# Patient Record
Sex: Female | Born: 1985 | Race: Black or African American | Hispanic: No | Marital: Single | Smoking: Current every day smoker
Health system: Southern US, Community
[De-identification: ages and names within clinical notes are randomized; demographics above are authoritative.]

## PROBLEM LIST (undated history)

## (undated) DIAGNOSIS — G43909 Migraine, unspecified, not intractable, without status migrainosus: Secondary | ICD-10-CM

## (undated) DIAGNOSIS — F419 Anxiety disorder, unspecified: Secondary | ICD-10-CM

## (undated) HISTORY — DX: Migraine, unspecified, not intractable, without status migrainosus: G43.909

## (undated) HISTORY — PX: HERNIA REPAIR: SHX51

---

## 2005-04-14 ENCOUNTER — Emergency Department (HOSPITAL_COMMUNITY): Admission: EM | Admit: 2005-04-14 | Discharge: 2005-04-15 | Payer: Self-pay | Admitting: Emergency Medicine

## 2005-06-25 ENCOUNTER — Emergency Department (HOSPITAL_COMMUNITY): Admission: EM | Admit: 2005-06-25 | Discharge: 2005-06-25 | Payer: Self-pay | Admitting: Emergency Medicine

## 2007-07-22 ENCOUNTER — Emergency Department (HOSPITAL_COMMUNITY): Admission: EM | Admit: 2007-07-22 | Discharge: 2007-07-22 | Payer: Self-pay | Admitting: Emergency Medicine

## 2008-03-04 ENCOUNTER — Emergency Department (HOSPITAL_COMMUNITY): Admission: EM | Admit: 2008-03-04 | Discharge: 2008-03-04 | Payer: Self-pay | Admitting: Emergency Medicine

## 2011-03-20 LAB — COMPREHENSIVE METABOLIC PANEL
AST: 25
Albumin: 3.8
Alkaline Phosphatase: 51
BUN: 7
CO2: 27
Chloride: 102
GFR calc non Af Amer: >60
Potassium: 3.2 — ABNORMAL LOW
Total Bilirubin: 0.7

## 2011-03-20 LAB — URINALYSIS, ROUTINE W REFLEX MICROSCOPIC
Bilirubin Urine: NEGATIVE
Glucose, UA: NEGATIVE
Hgb urine dipstick: NEGATIVE
Ketones, ur: >80 — AB
Protein, ur: NEGATIVE
pH: 6

## 2011-03-20 LAB — CBC
HCT: 39.7
Platelets: 245
RBC: 4.29
WBC: 2.7 — ABNORMAL LOW

## 2011-03-20 LAB — DIFFERENTIAL
Basophils Absolute: 0
Basophils Relative: 0
Eosinophils Relative: 1
Monocytes Absolute: 0.5

## 2011-03-20 LAB — RAPID STREP SCREEN (MED CTR MEBANE ONLY): Streptococcus, Group A Screen (Direct): NEGATIVE

## 2011-04-02 LAB — POCT I-STAT, CHEM 8
Calcium, Ion: 1.24
Creatinine, Ser: 1
Glucose, Bld: 107 — ABNORMAL HIGH
Hemoglobin: 15.6 — ABNORMAL HIGH
TCO2: 29

## 2011-06-22 ENCOUNTER — Emergency Department (HOSPITAL_COMMUNITY): Payer: Managed Care, Other (non HMO)

## 2011-06-22 ENCOUNTER — Emergency Department (HOSPITAL_COMMUNITY)
Admission: EM | Admit: 2011-06-22 | Discharge: 2011-06-22 | Disposition: A | Discharge status: Home / Self Care | Payer: Managed Care, Other (non HMO) | Attending: Emergency Medicine | Admitting: Emergency Medicine

## 2011-06-22 DIAGNOSIS — R079 Chest pain, unspecified: Secondary | ICD-10-CM | POA: Insufficient documentation

## 2011-06-22 DIAGNOSIS — S20219A Contusion of unspecified front wall of thorax, initial encounter: Secondary | ICD-10-CM
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
## 2011-06-22 MED ORDER — NAPROXEN 375 MG PO TABS: 375.0000 mg | ORAL_TABLET | Freq: Two times a day (BID) | ORAL | Status: AC

## 2011-06-22 MED ORDER — HYDROCODONE-ACETAMINOPHEN 5-500 MG PO TABS: 1.0000 | ORAL_TABLET | Freq: Four times a day (QID) | ORAL | Status: AC | PRN
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
## 2011-06-23 NOTE — ED Provider Notes (Signed)
Medical screening examination/treatment/procedure(s) were performed by non-physician practitioner and as supervising physician I was immediately available for consultation/collaboration.   Castle Lamons, MD 06/23/11 0039 

## 2016-08-12 ENCOUNTER — Encounter (HOSPITAL_COMMUNITY): Payer: Self-pay | Admitting: Emergency Medicine

## 2016-08-12 DIAGNOSIS — F17210 Nicotine dependence, cigarettes, uncomplicated: Secondary | ICD-10-CM | POA: Insufficient documentation

## 2016-08-12 DIAGNOSIS — R51 Headache: Secondary | ICD-10-CM | POA: Diagnosis not present

## 2016-08-12 DIAGNOSIS — M791 Myalgia: Secondary | ICD-10-CM | POA: Insufficient documentation

## 2016-08-12 DIAGNOSIS — R509 Fever, unspecified: Secondary | ICD-10-CM | POA: Diagnosis not present

## 2016-08-12 DIAGNOSIS — R05 Cough: Secondary | ICD-10-CM | POA: Diagnosis not present

## 2016-08-12 DIAGNOSIS — R0981 Nasal congestion: Secondary | ICD-10-CM | POA: Diagnosis not present

## 2016-08-12 DIAGNOSIS — R067 Sneezing: Secondary | ICD-10-CM | POA: Diagnosis not present

## 2016-08-12 DIAGNOSIS — J029 Acute pharyngitis, unspecified: Secondary | ICD-10-CM | POA: Insufficient documentation

## 2016-08-12 DIAGNOSIS — J3489 Other specified disorders of nose and nasal sinuses: Secondary | ICD-10-CM | POA: Insufficient documentation

## 2016-08-12 DIAGNOSIS — R5383 Other fatigue: Secondary | ICD-10-CM | POA: Insufficient documentation

## 2016-08-12 DIAGNOSIS — M549 Dorsalgia, unspecified: Secondary | ICD-10-CM | POA: Insufficient documentation

## 2016-08-12 LAB — RAPID STREP SCREEN (MED CTR MEBANE ONLY): Streptococcus, Group A Screen (Direct): NEGATIVE

## 2016-08-12 NOTE — ED Triage Notes (Signed)
Patient complaining of sore throat, back pain, and fatigue since awakening this morning.

## 2016-08-13 ENCOUNTER — Emergency Department (HOSPITAL_COMMUNITY)
Admission: EM | Admit: 2016-08-13 | Discharge: 2016-08-13 | Disposition: A | Discharge status: Home / Self Care | Payer: 59 | Attending: Emergency Medicine | Admitting: Emergency Medicine

## 2016-08-13 ENCOUNTER — Encounter (HOSPITAL_COMMUNITY): Payer: Self-pay | Admitting: Emergency Medicine

## 2016-08-13 DIAGNOSIS — J111 Influenza due to unidentified influenza virus with other respiratory manifestations: Secondary | ICD-10-CM

## 2016-08-13 DIAGNOSIS — J029 Acute pharyngitis, unspecified: Secondary | ICD-10-CM

## 2016-08-13 DIAGNOSIS — R69 Illness, unspecified: Secondary | ICD-10-CM

## 2016-08-13 LAB — URINALYSIS, ROUTINE W REFLEX MICROSCOPIC
BILIRUBIN URINE: NEGATIVE
Bacteria, UA: NONE SEEN
Glucose, UA: NEGATIVE mg/dL
Ketones, ur: NEGATIVE mg/dL
Leukocytes, UA: NEGATIVE
Nitrite: NEGATIVE
PH: 8 (ref 5.0–8.0)
Protein, ur: NEGATIVE mg/dL
SPECIFIC GRAVITY, URINE: 1.006 (ref 1.005–1.030)

## 2016-08-13 MED ORDER — GUAIFENESIN 100 MG/5ML PO LIQD: 100.0000 mg | ORAL | 0 refills | Status: DC | PRN

## 2016-08-13 MED ORDER — FLUTICASONE PROPIONATE 50 MCG/ACT NA SUSP: 2.0000 | Freq: Every day | NASAL | 0 refills | Status: DC

## 2016-08-13 MED ORDER — BENZONATATE 100 MG PO CAPS: 100.0000 mg | ORAL_CAPSULE | Freq: Three times a day (TID) | ORAL | 0 refills | Status: DC | PRN

## 2016-08-13 MED ORDER — HYDROCODONE-HOMATROPINE 5-1.5 MG/5ML PO SYRP: 5.0000 mL | ORAL_SOLUTION | Freq: Four times a day (QID) | ORAL | 0 refills | Status: DC | PRN

## 2016-08-13 MED ORDER — ACETAMINOPHEN 500 MG PO TABS
1000.0000 mg | ORAL_TABLET | Freq: Once | ORAL | Status: AC
  Administered 2016-08-13: 1000 mg via ORAL
  Filled 2016-08-13: qty 2

## 2016-08-13 MED ORDER — IBUPROFEN 400 MG PO TABS
400.0000 mg | ORAL_TABLET | Freq: Once | ORAL | Status: AC
  Administered 2016-08-13: 400 mg via ORAL
  Filled 2016-08-13: qty 1

## 2016-08-13 NOTE — ED Notes (Signed)
Pt ambulatory to waiting room. Pt verbalized understanding of discharge instructions.   

## 2016-08-13 NOTE — ED Provider Notes (Signed)
[facility]-EMERGENCY DEPT Provider Note   CSN: [id number] Arrival date & time: 08/12/16  2247  History   Chief Complaint Chief Complaint  Patient presents with  . Sore Throat    HPI Teresa Lane is a 31 y.o. female with pertinent past medical history of tobacco abuse presents to the emergency department reporting sore throat, headache, nonproductive cough, constant upper back pain described as achy, runny nose and nasal congestion, low grade fever and generalized "weakness" and decreased energy since this morning. Patient denies exertional chest pain, shortness of breath, nausea, vomiting, abdominal pain, urinary symptoms, vaginal discharge or bleeding, rashes, neck pain or rigidity.  No extremity weakness, numbness or tingling. No known sick contacts with similar symptoms.  HPI  History reviewed. No pertinent past medical history.  There are no active problems to display for this patient.   Past Surgical History:  Procedure Laterality Date  . HERNIA REPAIR      OB History    Gravida Para Term Preterm AB Living   1             SAB TAB Ectopic Multiple Live Births                   Home Medications    Prior to Admission medications   Medication Sig Start Date End Date Taking? Authorizing Provider  benzonatate (TESSALON PERLES) 100 MG capsule Take 1 capsule (100 mg total) by mouth 3 (three) times daily as needed for cough. FOR DISRUPTIVE DAY TIME COUGH 08/13/16   Liberty Handy, PA-C  fluticasone [facility]) 50 MCG/ACT nasal spray Place 2 sprays into both nostrils daily. FOR NASAL CONGESTION 08/13/16   Liberty Handy, PA-C  guaiFENesin (ROBITUSSIN) 100 MG/5ML liquid Take 5-10 mLs (100-200 mg total) by mouth every 4 (four) hours as needed for cough. 08/13/16   Liberty Handy, PA-C  HYDROcodone-homatropine (HYCODAN) 5-1.5 MG/5ML syrup Take 5 mLs by mouth every 6 (six) hours as needed for cough. FOR DISRUPTIVE NIGHT TIME COUGH AND BODY ACHES 08/13/16   Liberty Handy, PA-C      Family History History reviewed. No pertinent family history.  Social History Social History  Substance Use Topics  . Smoking status: Current Every Day Smoker    Types: Cigarettes  . Smokeless tobacco: Never Used  . Alcohol use Yes     Comment: occasionally     Allergies   Patient has no known allergies.   Review of Systems Review of Systems  Constitutional: Positive for fatigue and fever. Negative for appetite change and chills.  HENT: Positive for congestion, rhinorrhea, sneezing and sore throat. Negative for ear pain and voice change.   Eyes: Negative for visual disturbance.  Respiratory: Positive for cough. Negative for choking, chest tightness and shortness of breath.   Cardiovascular: Negative for chest pain, palpitations and leg swelling.  Gastrointestinal: Negative for abdominal pain, constipation, diarrhea, nausea and vomiting.  Genitourinary: Negative for difficulty urinating, dysuria, flank pain, frequency, hematuria, menstrual problem, pelvic pain, urgency, vaginal bleeding and vaginal discharge.  Musculoskeletal: Positive for back pain and myalgias. Negative for arthralgias, neck pain and neck stiffness.  Skin: Negative for rash and wound.  Neurological: Positive for weakness and headaches. Negative for dizziness, seizures, syncope, light-headedness and numbness.  Hematological: Does not bruise/bleed easily.  Psychiatric/Behavioral: Negative.    Physical Exam Updated Vital Signs BP 126/88 (BP Location: Left Arm)   Pulse 108   Temp 99.5 F (37.5 C) (Oral)   Resp 16   Ht  5\' 5"  (1.651 m)   Wt 57 kg   LMP 08/05/2016   SpO2 100%   Breastfeeding? Unknown   BMI 20.92 kg/m   Physical Exam  Constitutional: She is oriented to person, place, and time. She appears well-developed and well-nourished.  NAD.  HENT:  Head: Normocephalic and atraumatic.  Nose: Mucosal edema present.  Mouth/Throat: No trismus in the jaw. No uvula swelling. Posterior oropharyngeal  edema and posterior oropharyngeal erythema present. Tonsils are 1+ on the right. Tonsils are 1+ on the left.  Moist mucous membranes.  Moderate nasal mucosa edema without nasal discharge noted. Sinuses non tender. Oropharynx and tonsils with mild erythema and edema without exudates or lesions.  Soft palate flat without erythema or edema. Uvula midline. No trismus.  No pooling of oral secretions.  Eyes: Conjunctivae and EOM are normal. Pupils are equal, round, and reactive to light.  Neck:  Full neck ROM, no cervical adenopathy, no midline cervical tenderness.  Cardiovascular: Normal rate, regular rhythm, normal heart sounds and intact distal pulses.   No murmur heard. Pulmonary/Chest:  RR within normal limits. SpO2 within normal limits.  Normal breathing effort. Patient speaking in full sentences. No pursed lip breathing. No chest wall retractions. No cyanosis. Chest wall expansion symmetric.  No chest wall tenderness. Lungs CTAB anteriorly and posteriorly without wheezing, rhonchi or crackles.  No egophony.   Abdominal: There is CVA tenderness.  No surgical abdominal scars noted. No pulsating masses.  + Bowel sounds throughout.  Abdomen is soft, non tender without distention, rigidity, guarding or rebound.  No suprapubic tenderness.  +Bilateral CVAT.  Negative Murphy's. Negative McBurney's.  Non palpable kidneys. No hepatosplenomegaly.   Musculoskeletal: Normal range of motion. She exhibits tenderness. She exhibits no deformity.  No midline CTL spine tenderness.  Diffuse paraspinal muscular tenderness most significant at thoracic spine.  Neurological: She is alert and oriented to person, place, and time. No sensory deficit.  Skin: Skin is warm and dry. Capillary refill takes less than 2 seconds.  Psychiatric: She has a normal mood and affect. Her behavior is normal. Judgment and thought content normal.  Nursing note and vitals reviewed.  ED Treatments / Results  Labs (all labs  ordered are listed, but only abnormal results are displayed) Labs Reviewed  URINALYSIS, ROUTINE W REFLEX MICROSCOPIC - Abnormal; Notable for the following:       Result Value   Hgb urine dipstick MODERATE (*)    All other components within normal limits  RAPID STREP SCREEN (NOT AT [facility])  CULTURE, GROUP A STREP [facility])    EKG  EKG Interpretation None       Radiology No results found.  Procedures Procedures (including critical care time)  Medications Ordered in ED Medications  ibuprofen (ADVIL,MOTRIN) tablet 400 mg (400 mg Oral Given 08/13/16 0050)  acetaminophen (TYLENOL) tablet 1,000 mg (1,000 mg Oral Given 08/13/16 0050)     Initial Impression / Assessment and Plan / ED Course  I have reviewed the triage vital signs and the nursing notes.  Pertinent labs & imaging results that were available during my care of the patient were reviewed by me and considered in my medical decision making (see chart for details).  Clinical Course as of Aug 13 206  Wed Aug 13, 2016  0203 Negative nitrites Nitrite: NEGATIVE [CG]    Clinical Course User Index [CG] Liberty Handy, PA-C    31 y.o. -year-old female with pertinent pmh of tobacco abuse presents to the ED with sore throat, gradual  in onset headache, nonproductive cough, constant diffuse back pain described as achy, runny nose, sneezing, nasal congestion, low grade fever and generalized body "weakness" and decreased energy since this morning.  Patient has not taken any OTC medications for symptoms PTA.  On my exam patient is nontoxic appearing, speaking in full sentences. Initial low grade fever at 100.32F which improved to 99.75F without antipyretics, no tachypnea, no tachycardia, normal oxygen saturations. Lungs are clear to auscultation bilaterally without wheezing, rales, egophony. I do not think that a chest x-ray is indicated at this time as vital signs are within normal limits, there are no signs of consolidation on chest exam,  there is no hypoxia. There is diffuse tenderness on back most significant at thoracic paraspinal muscular area, no midline spinal tenderness.  Patient reported "soreness" with CVA taps bilaterally although denies dysuria, frequency, urgency, vaginal discharge, itching or bleeding.  LMP 08/05/16.    I doubt bacterial bronchitis, pneumonia, PE. Patient denies urinary symptoms, doubt UTI or pyelo.  Suspect viral URI causing backache.  U/A without nitrites, leukocytes and WBCs.  Moderate hgb I suspect from recent menstrual cycle 7 days ago.  I think that symptoms can be treated conservatively at this point. Given reassuring physical exam and vital signs within normal limits patient will be discharged with symptomatic treatment including rest, fluids, Flonase, Tessalon, Robitussin, Hycodan. Strict ED return precautions given. Patient is aware that a viral URI infection may precede the onset of bacterial bronchitis or pneumonia. Patient is aware of red flag symptoms to monitor for that would warrant return to the ED for further reevaluation. Patient tolerate PO challenge without n/v/abdominal pain.  Final Clinical Impressions(s) / ED Diagnoses   Final diagnoses:  Viral pharyngitis  Sore throat  Influenza-like illness    New Prescriptions New Prescriptions   BENZONATATE (TESSALON PERLES) 100 MG CAPSULE    Take 1 capsule (100 mg total) by mouth 3 (three) times daily as needed for cough. FOR DISRUPTIVE DAY TIME COUGH   FLUTICASONE (FLONASE) 50 MCG/ACT NASAL SPRAY    Place 2 sprays into both nostrils daily. FOR NASAL CONGESTION   GUAIFENESIN (ROBITUSSIN) 100 MG/5ML LIQUID    Take 5-10 mLs (100-200 mg total) by mouth every 4 (four) hours as needed for cough.   HYDROCODONE-HOMATROPINE (HYCODAN) 5-1.5 MG/5ML SYRUP    Take 5 mLs by mouth every 6 (six) hours as needed for cough. FOR DISRUPTIVE NIGHT TIME COUGH AND BODY ACHES     Liberty HandyClaudia J Gibbons, PA-C 08/13/16 [id number]    Devoria AlbeIva Knapp, MD 08/13/16 [phone]

## 2016-08-13 NOTE — Discharge Instructions (Signed)
Your sore throat, cough, nasal congestion, headache and back and body aches are most likely due to a viral upper respiratory infection and viral pharyngitis.  Your strep test was negative today.  It is also possible you may have early symptoms of the flu.  Because you are otherwise a healthy person you do not need testing or tamiflu for the flu as I suspect you will recover within a week.  We will treat your symptoms today with flonase for nasal congestion, ibuprofen 600 mg + tylenol 650 mg every 8 hours for fever/sore throat/aches, salt water gargles for throat inflammation, mucinex for chest congestion/phlegm and hycodan syrup for body aches and night time cough. Your symptoms should begin to improve in 5-7 days.    Return to the emergency department if your symptoms worsen after 7 days or if you develop recurrent vomiting, diarrhea or unable to keep fluids down.    Please avoid going to work or school if you are still having a fever (temperature > 100 F).  You may use your work note as needed.

## 2016-08-16 LAB — CULTURE, GROUP A STREP (THRC)

## 2018-03-21 ENCOUNTER — Encounter (HOSPITAL_COMMUNITY): Payer: Self-pay | Admitting: Emergency Medicine

## 2018-03-21 ENCOUNTER — Other Ambulatory Visit: Payer: Self-pay

## 2018-03-21 DIAGNOSIS — X500XXA Overexertion from strenuous movement or load, initial encounter: Secondary | ICD-10-CM | POA: Diagnosis not present

## 2018-03-21 DIAGNOSIS — M545 Low back pain: Secondary | ICD-10-CM | POA: Diagnosis not present

## 2018-03-21 DIAGNOSIS — F17210 Nicotine dependence, cigarettes, uncomplicated: Secondary | ICD-10-CM | POA: Insufficient documentation

## 2018-03-21 NOTE — ED Triage Notes (Signed)
Pt reports lower back pain and has been working opening to close. Increased fatigue.

## 2018-03-22 ENCOUNTER — Emergency Department (HOSPITAL_COMMUNITY)
Admission: EM | Admit: 2018-03-22 | Discharge: 2018-03-22 | Disposition: A | Discharge status: Home / Self Care | Payer: 59 | Attending: Emergency Medicine | Admitting: Emergency Medicine

## 2018-03-22 DIAGNOSIS — M545 Low back pain, unspecified: Secondary | ICD-10-CM

## 2018-03-22 MED ORDER — CYCLOBENZAPRINE HCL 10 MG PO TABS: 10.0000 mg | ORAL_TABLET | Freq: Three times a day (TID) | ORAL | 0 refills | Status: DC | PRN

## 2018-03-22 MED ORDER — OXYCODONE-ACETAMINOPHEN 5-325 MG PO TABS
1.0000 | ORAL_TABLET | Freq: Once | ORAL | Status: AC
  Administered 2018-03-22: 1 via ORAL
  Filled 2018-03-22: qty 1

## 2018-03-22 MED ORDER — NAPROXEN 500 MG PO TABS: 500.0000 mg | ORAL_TABLET | Freq: Two times a day (BID) | ORAL | 0 refills | Status: DC

## 2018-03-22 MED ORDER — CYCLOBENZAPRINE HCL 10 MG PO TABS
10.0000 mg | ORAL_TABLET | Freq: Once | ORAL | Status: AC
  Administered 2018-03-22: 10 mg via ORAL
  Filled 2018-03-22: qty 1

## 2018-03-22 MED ORDER — OXYCODONE-ACETAMINOPHEN 5-325 MG PO TABS: 1.0000 | ORAL_TABLET | ORAL | 0 refills | Status: DC | PRN

## 2018-03-22 MED ORDER — NAPROXEN 250 MG PO TABS
500.0000 mg | ORAL_TABLET | Freq: Once | ORAL | Status: AC
  Administered 2018-03-22: 500 mg via ORAL
  Filled 2018-03-22: qty 2

## 2018-03-22 NOTE — Discharge Instructions (Addendum)
Apply ice several times a day.  Take acetaminophen for additional pain relief.

## 2018-03-22 NOTE — ED Provider Notes (Signed)
[facility] EMERGENCY DEPARTMENT Provider Note   CSN: [id number] Arrival date & time: 03/21/18  2226     History   Chief Complaint Chief Complaint  Patient presents with  . Back Pain    HPI Teresa Lane is a 32 y.o. female.  The history is provided by the patient.  She states that she has been working very long days at her place of employment and having to do a lot of lifting over the last week.  For the last 4 days, she has been having increasing pain in her lower back.  There is occasional radiation to the right buttock.  She denies weakness, numbness, tingling.  She denies bowel or bladder dysfunction.  She has taken over-the-counter acetaminophen without relief.  Pain is rated at 10/10.  She denies prior history of back pain.  History reviewed. No pertinent past medical history.  There are no active problems to display for this patient.   Past Surgical History:  Procedure Laterality Date  . HERNIA REPAIR       OB History    Gravida  1   Para      Term      Preterm      AB      Living        SAB      TAB      Ectopic      Multiple      Live Births               Home Medications    Prior to Admission medications   Medication Sig Start Date End Date Taking? Authorizing Provider  benzonatate (TESSALON PERLES) 100 MG capsule Take 1 capsule (100 mg total) by mouth 3 (three) times daily as needed for cough. FOR DISRUPTIVE DAY TIME COUGH 08/13/16   Liberty Handy, PA-C  fluticasone [facility]) 50 MCG/ACT nasal spray Place 2 sprays into both nostrils daily. FOR NASAL CONGESTION 08/13/16   Liberty Handy, PA-C  guaiFENesin (ROBITUSSIN) 100 MG/5ML liquid Take 5-10 mLs (100-200 mg total) by mouth every 4 (four) hours as needed for cough. 08/13/16   Liberty Handy, PA-C  HYDROcodone-homatropine (HYCODAN) 5-1.5 MG/5ML syrup Take 5 mLs by mouth every 6 (six) hours as needed for cough. FOR DISRUPTIVE NIGHT TIME COUGH AND BODY ACHES 08/13/16    Liberty Handy, PA-C    Family History History reviewed. No pertinent family history.  Social History Social History   Tobacco Use  . Smoking status: Current Every Day Smoker    Packs/day: 1.00    Types: Cigarettes  . Smokeless tobacco: Never Used  Substance Use Topics  . Alcohol use: Yes    Comment: occasionally  . Drug use: No     Allergies   Patient has no known allergies.   Review of Systems Review of Systems  All other systems reviewed and are negative.    Physical Exam Updated Vital Signs BP (!) 144/93 (BP Location: Right Arm)   Pulse 100   Temp (!) 97.5 F (36.4 C) (Temporal)   Resp 14   Ht 5\' 5"  (1.651 m)   Wt 59 kg   LMP 02/15/2018   SpO2 100%   BMI 21.63 kg/m   Physical Exam  Nursing note and vitals reviewed.  32 year old female, resting comfortably and in no acute distress. Vital signs are significant for elevated blood pressure. Oxygen saturation is 100%, which is normal. Head is normocephalic and atraumatic. PERRLA, EOMI. Oropharynx  is clear. Neck is nontender and supple without adenopathy or JVD. Back is tender in the mid and lower lumbar area.  There is moderate bilateral paralumbar spasm with tenderness.  Straight leg raise is positive on the right at 30 degrees and on the left at 45 degrees.  There is no CVA tenderness. Lungs are clear without rales, wheezes, or rhonchi. Chest is nontender. Heart has regular rate and rhythm without murmur. Abdomen is soft, flat, nontender without masses or hepatosplenomegaly and peristalsis is normoactive. Extremities have no cyanosis or edema, full range of motion is present. Skin is warm and dry without rash. Neurologic: Mental status is normal, cranial nerves are intact, there are no motor or sensory deficits.  ED Treatments / Results   Procedures Procedures  Medications Ordered in ED Medications  oxyCODONE-acetaminophen (PERCOCET/ROXICET) 5-325 MG per tablet 1 tablet (has no administration in  time range)  naproxen (NAPROSYN) tablet 500 mg (has no administration in time range)  cyclobenzaprine (FLEXERIL) tablet 10 mg (has no administration in time range)     Initial Impression / Assessment and Plan / ED Course  I have reviewed the triage vital signs and the nursing notes.  Low back pain which clearly is musculoskeletal.  No red flags to suggest more serious pathology.  Old records are reviewed, and she has no relevant past visits.  No indications for imaging today.  She is discharged with prescriptions for naproxen and cyclobenzaprine, also given a take-home pack of oxycodone-acetaminophen.  Given 2 days off from work in light duty for the next week.  Recommended ice and stretching.  Given back exercises to do at home.  Return precautions discussed.  Final Clinical Impressions(s) / ED Diagnoses   Final diagnoses:  Acute midline low back pain without sciatica    ED Discharge Orders         Ordered    naproxen (NAPROSYN) 500 MG tablet  2 times daily     03/22/18 0101    cyclobenzaprine (FLEXERIL) 10 MG tablet  3 times daily PRN     03/22/18 0101    oxyCODONE-acetaminophen (PERCOCET) 5-325 MG tablet  Every 4 hours PRN     03/22/18 0104           Dione BoozeGlick, Matej Sappenfield, MD 03/22/18 0109

## 2018-03-23 MED FILL — Oxycodone w/ Acetaminophen Tab 5-325 MG: ORAL | Qty: 6 | Status: AC

## 2022-02-22 ENCOUNTER — Other Ambulatory Visit: Payer: Self-pay

## 2022-02-22 ENCOUNTER — Emergency Department (HOSPITAL_COMMUNITY)
Admission: EM | Admit: 2022-02-22 | Discharge: 2022-02-22 | Disposition: A | Discharge status: Home / Self Care | Payer: BLUE CROSS/BLUE SHIELD | Attending: Emergency Medicine | Admitting: Emergency Medicine

## 2022-02-22 ENCOUNTER — Encounter (HOSPITAL_COMMUNITY): Payer: Self-pay | Admitting: Emergency Medicine

## 2022-02-22 DIAGNOSIS — U071 COVID-19: Secondary | ICD-10-CM | POA: Diagnosis not present

## 2022-02-22 DIAGNOSIS — Z20822 Contact with and (suspected) exposure to covid-19: Secondary | ICD-10-CM

## 2022-02-22 DIAGNOSIS — R509 Fever, unspecified: Secondary | ICD-10-CM | POA: Diagnosis present

## 2022-02-22 LAB — SARS CORONAVIRUS 2 BY RT PCR: SARS Coronavirus 2 by RT PCR: POSITIVE — AB

## 2022-02-22 NOTE — ED Provider Notes (Addendum)
[facility] EMERGENCY DEPARTMENT Provider Note   CSN: [id number] Arrival date & time: 02/22/22  0041     History  No chief complaint on file.   Teresa Lane is a 36 y.o. female.  HPI     This is a 36 year old female who presents requesting COVID testing.  Patient reports 1 day history of worsening chills at home.  She reports a fever up to 102.  She has had some congestion and some nausea.  No abdominal pain, cough, shortness of breath, chest pain.  She states she has had multiple sick contacts at work and is concerned she may have COVID.  She is requesting a COVID test.  Denies urinary symptoms.  Home Medications Prior to Admission medications   Medication Sig Start Date End Date Taking? Authorizing Provider  cyclobenzaprine (FLEXERIL) 10 MG tablet Take 1 tablet (10 mg total) by mouth 3 (three) times daily as needed for muscle spasms. 03/22/18   Dione Booze, MD  naproxen (NAPROSYN) 500 MG tablet Take 1 tablet (500 mg total) by mouth 2 (two) times daily. 03/22/18   Dione Booze, MD  oxyCODONE-acetaminophen (PERCOCET) 5-325 MG tablet Take 1 tablet by mouth every 4 (four) hours as needed for moderate pain. 03/22/18   Dione Booze, MD      Allergies    Patient has no known allergies.    Review of Systems   Review of Systems  Constitutional:  Positive for chills and fever.  All other systems reviewed and are negative.   Physical Exam Updated Vital Signs BP (!) 145/96 (BP Location: Left Arm)   Pulse 97   Temp 99 F (37.2 C) (Oral)   Resp 18   Ht 1.651 m (5\' 5" )   Wt 59 kg   LMP 02/15/2022   SpO2 100%   BMI 21.64 kg/m  Physical Exam Vitals and nursing note reviewed.  Constitutional:      Appearance: She is well-developed. She is not ill-appearing.  HENT:     Head: Normocephalic and atraumatic.  Eyes:     Pupils: Pupils are equal, round, and reactive to light.  Cardiovascular:     Rate and Rhythm: Normal rate and regular rhythm.  Pulmonary:     Effort:  Pulmonary effort is normal. No respiratory distress.  Abdominal:     Palpations: Abdomen is soft.  Musculoskeletal:     Cervical back: Neck supple.  Skin:    General: Skin is warm and dry.  Neurological:     Mental Status: She is alert and oriented to person, place, and time.  Psychiatric:        Mood and Affect: Mood normal.     ED Results / Procedures / Treatments   Labs (all labs ordered are listed, but only abnormal results are displayed) Labs Reviewed  SARS CORONAVIRUS 2 BY RT PCR    EKG None  Radiology No results found.  Procedures Procedures    Medications Ordered in ED Medications - No data to display  ED Course/ Medical Decision Making/ A&P                           Medical Decision Making  This patient presents to the ED for concern of fever, chills, this involves an extensive number of treatment options, and is a complaint that carries with it a high risk of complications and morbidity.  I considered the following differential and admission for this acute, potentially life threatening condition.  The differential diagnosis includes viral illness including COVID-19, influenza, common cold, UTI, pneumonia  MDM:    This is a 36 year old female who presents with fever and chills.  She is nontoxic.  She is afebrile here.  She reports some upper respiratory symptoms including congestion.  Reports multiple sick contacts.  Denies urinary or pulmonary symptoms.  She is in no respiratory distress.  COVID testing was sent.  She does not wish to wait on her results.  Do not feel she needs further testing at this time given absence of other significant symptoms.  (Labs, imaging, consults)  Labs: I Ordered, and personally interpreted labs.  The pertinent results include: Testing pending  3:10 AM COVID test noted to be positive.  Requested nursing call patient with positive results.  Referred to CDC guidelines for quarantine information.  Imaging Studies ordered: I  ordered imaging studies including none I independently visualized and interpreted imaging. I agree with the radiologist interpretation  Additional history obtained from chart review.  External records from outside source obtained and reviewed including prior evaluations  Cardiac Monitoring: The patient was maintained on a cardiac monitor.  I personally viewed and interpreted the cardiac monitored which showed an underlying rhythm of: Normal sinus rhythm  Reevaluation: After the interventions noted above, I reevaluated the patient and found that they have :improved  Social Determinants of Health: Lives independently  Disposition: Discharge  Co morbidities that complicate the patient evaluation History reviewed. No pertinent past medical history.   Medicines No orders of the defined types were placed in this encounter.   I have reviewed the patients home medicines and have made adjustments as needed  Problem List / ED Course: Problem List Items Addressed This Visit   None Visit Diagnoses     Encounter for laboratory testing for COVID-19 virus    -  Primary                   Final Clinical Impression(s) / ED Diagnoses Final diagnoses:  Encounter for laboratory testing for COVID-19 virus    Rx / DC Orders ED Discharge Orders     None         Toi Stelly, Mayer Masker, MD 02/22/22 0272    Shon Baton, MD 02/22/22 [phone]

## 2022-02-22 NOTE — ED Notes (Signed)
Pt reports she is ready to be discharged. EDP notified Pt that she will be notified via telephone if her Covid Test returns positive.

## 2022-02-22 NOTE — ED Notes (Signed)
Date and time results received: 02/22/22 0258  Test: COVID Critical Value: POSITIVE  Name of Provider Notified: Cristy Folks MD

## 2022-02-22 NOTE — ED Notes (Signed)
ED Provider at bedside. 

## 2022-02-22 NOTE — Discharge Instructions (Signed)
You were seen today for COVID-19 testing.  We will call you if your test is positive.  If testing is positive, you should quarantine based on CDC recommendations.

## 2022-02-22 NOTE — ED Triage Notes (Signed)
Pt here for Covid test. States she feels weak and has been having fever with chills.

## 2023-05-09 ENCOUNTER — Other Ambulatory Visit: Payer: Self-pay

## 2023-05-09 ENCOUNTER — Emergency Department (HOSPITAL_COMMUNITY)
Admission: EM | Admit: 2023-05-09 | Discharge: 2023-05-09 | Disposition: A | Discharge status: Home / Self Care | Payer: BC Managed Care – PPO | Attending: Emergency Medicine | Admitting: Emergency Medicine

## 2023-05-09 ENCOUNTER — Encounter (HOSPITAL_COMMUNITY): Payer: Self-pay

## 2023-05-09 DIAGNOSIS — R21 Rash and other nonspecific skin eruption: Secondary | ICD-10-CM | POA: Diagnosis present

## 2023-05-09 MED ORDER — IBUPROFEN 400 MG PO TABS
600.0000 mg | ORAL_TABLET | Freq: Once | ORAL | Status: AC
  Administered 2023-05-09: 600 mg via ORAL
  Filled 2023-05-09: qty 2

## 2023-05-09 MED ORDER — DOXYCYCLINE HYCLATE 100 MG PO CAPS: 100.0000 mg | ORAL_CAPSULE | Freq: Two times a day (BID) | ORAL | 0 refills | Status: DC

## 2023-05-09 MED ORDER — DOXYCYCLINE HYCLATE 100 MG PO TABS
100.0000 mg | ORAL_TABLET | Freq: Once | ORAL | Status: AC
  Administered 2023-05-09: 100 mg via ORAL
  Filled 2023-05-09: qty 1

## 2023-05-09 NOTE — Discharge Instructions (Addendum)
Evaluation today revealed that you likely had a small abscess under your left armpit. I am starting you on doxycycline which is an antibiotic to treat for associated infection.  Please follow-up with your PCP.  If the area of concern becomes more red, more swollen, you develop a fever or chills or any other concerning symptom please return emerged part further evaluation.

## 2023-05-09 NOTE — ED Triage Notes (Signed)
Rash in LEFT armpit Started 3 days ago Pt stated that bumps are oozing pus

## 2023-05-09 NOTE — ED Provider Notes (Signed)
[place] EMERGENCY DEPARTMENT AT [facility] Provider Note   CSN: [id number] Arrival date & time: 05/09/23  1235     History  Chief Complaint  Patient presents with   Rash   HPI Teresa Lane is a 37 y.o. female presenting for rash.  States she noticed a rash under her left armpit 3 days ago.  States "it looks like bumps".  Yesterday it started to ooze "pus".  Denies fever and chills.   Rash      Home Medications Prior to Admission medications   Medication Sig Start Date End Date Taking? Authorizing Provider  cyclobenzaprine (FLEXERIL) 10 MG tablet Take 1 tablet (10 mg total) by mouth 3 (three) times daily as needed for muscle spasms. Patient not taking: Reported on 05/09/2023 03/22/18   Dione Booze, MD  naproxen (NAPROSYN) 500 MG tablet Take 1 tablet (500 mg total) by mouth 2 (two) times daily. Patient not taking: Reported on 05/09/2023 03/22/18   Dione Booze, MD  oxyCODONE-acetaminophen (PERCOCET) 5-325 MG tablet Take 1 tablet by mouth every 4 (four) hours as needed for moderate pain. Patient not taking: Reported on 05/09/2023 03/22/18   Dione Booze, MD      Allergies    Penicillins    Review of Systems   Review of Systems  Skin:  Positive for rash.    Physical Exam Updated Vital Signs BP (!) 135/91 (BP Location: Right Arm)   Pulse 90   Temp 98.5 F (36.9 C) (Oral)   Resp 14   Ht 5\' 5"  (1.651 m)   Wt 62.7 kg   SpO2 100%   BMI 23.01 kg/m  Physical Exam Constitutional:      Appearance: Normal appearance.  HENT:     Head: Normocephalic.     Nose: Nose normal.  Eyes:     Conjunctiva/sclera: Conjunctivae normal.  Pulmonary:     Effort: Pulmonary effort is normal.  Skin:    Comments: 1.5 inch area of induration noted about the left axilla.  Not erythematous.  There was some evidence of oozing discharge.  No palpable fluctuance.  Was tender to touch.  Inspected with ultrasound and cobblestoning noted but no observable collection of fluid  subcutaneously.  Neurological:     Mental Status: She is alert.  Psychiatric:        Mood and Affect: Mood normal.     ED Results / Procedures / Treatments   Labs (all labs ordered are listed, but only abnormal results are displayed) Labs Reviewed - No data to display  EKG None  Radiology No results found.  Procedures Procedures    Medications Ordered in ED Medications  ibuprofen (ADVIL) tablet 600 mg (has no administration in time range)  doxycycline (VIBRA-TABS) tablet 100 mg (has no administration in time range)    ED Course/ Medical Decision Making/ A&P                                 Medical Decision Making  37 year old well-appearing female presenting for rash.  Exam notable for what appeared to be a small oozing abscess about the left axilla.  Suspect it may burst did yesterday and now no observable fluid collection with ultrasound noted today.  It still is tender.  Treated with doxycycline and ibuprofen for pain.  Advised her to follow-up PCP.  Discussed pertinent return precautions.  Vital stable.  Discharged good condition.  Final Clinical Impression(s) / ED Diagnoses Final diagnoses:  Rash    Rx / DC Orders ED Discharge Orders     None         Gareth Eagle, PA-C 05/09/23 1457    Vanetta Mulders, MD 05/10/23 1143

## 2023-07-27 ENCOUNTER — Ambulatory Visit (HOSPITAL_COMMUNITY)
Admission: RE | Admit: 2023-07-27 | Discharge: 2023-07-27 | Disposition: A | Discharge status: Home / Self Care | Payer: BC Managed Care – PPO | Source: Ambulatory Visit | Attending: Gerontology | Admitting: Gerontology

## 2023-07-27 ENCOUNTER — Other Ambulatory Visit (HOSPITAL_COMMUNITY): Payer: Self-pay | Admitting: Gerontology

## 2023-07-27 DIAGNOSIS — R059 Cough, unspecified: Secondary | ICD-10-CM | POA: Insufficient documentation

## 2023-08-11 ENCOUNTER — Encounter: Payer: Self-pay | Admitting: Adult Health

## 2023-08-11 ENCOUNTER — Other Ambulatory Visit (HOSPITAL_COMMUNITY)
Admission: RE | Admit: 2023-08-11 | Discharge: 2023-08-11 | Disposition: A | Discharge status: Home / Self Care | Payer: BC Managed Care – PPO | Source: Ambulatory Visit | Attending: Adult Health | Admitting: Adult Health

## 2023-08-11 ENCOUNTER — Ambulatory Visit: Payer: BC Managed Care – PPO | Admitting: Adult Health

## 2023-08-11 VITALS — BP 152/97 | HR 82 | Ht 65.0 in | Wt 139.0 lb

## 2023-08-11 DIAGNOSIS — Z1331 Encounter for screening for depression: Secondary | ICD-10-CM | POA: Diagnosis not present

## 2023-08-11 DIAGNOSIS — G43109 Migraine with aura, not intractable, without status migrainosus: Secondary | ICD-10-CM

## 2023-08-11 DIAGNOSIS — Z113 Encounter for screening for infections with a predominantly sexual mode of transmission: Secondary | ICD-10-CM | POA: Insufficient documentation

## 2023-08-11 DIAGNOSIS — Z124 Encounter for screening for malignant neoplasm of cervix: Secondary | ICD-10-CM

## 2023-08-11 DIAGNOSIS — R030 Elevated blood-pressure reading, without diagnosis of hypertension: Secondary | ICD-10-CM

## 2023-08-11 DIAGNOSIS — Z3202 Encounter for pregnancy test, result negative: Secondary | ICD-10-CM

## 2023-08-11 DIAGNOSIS — N946 Dysmenorrhea, unspecified: Secondary | ICD-10-CM | POA: Diagnosis not present

## 2023-08-11 DIAGNOSIS — N920 Excessive and frequent menstruation with regular cycle: Secondary | ICD-10-CM

## 2023-08-11 DIAGNOSIS — F419 Anxiety disorder, unspecified: Secondary | ICD-10-CM

## 2023-08-11 LAB — POCT URINE PREGNANCY: Preg Test, Ur: NEGATIVE

## 2023-08-11 MED ORDER — BUSPIRONE HCL 5 MG PO TABS: 5.0000 mg | ORAL_TABLET | Freq: Two times a day (BID) | ORAL | 3 refills | Status: DC

## 2023-08-11 NOTE — Progress Notes (Signed)
Subjective:     Patient ID: Teresa Lane, female   DOB: [birth year], 38 y.o.   MRN: [id number]  HPI Teresa Lane is a 38 year old black female, single, G0P0, in complaining of heavy painful periods. Has had for years, does not like going to doctor. Periods last 7 days and are heavy for 3-4 days, will change tampon every 2 hours on heavy days and has cramping for about 4 days.  Has never had a pap. 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 Her mom is with her.  PCP is Dr Felecia Shelling.  Review of Systems +heavy painful periods. Has had for years, does not like going to doctor. Periods last 7 days and are heavy for 3-4 days, will change tampon every 2 hours on heavy days and has cramping for about 4 days. Has migraines with auras Has female partners Reviewed past medical,surgical, social and family history. Reviewed medications and allergies.     Objective:   Physical Exam BP (!) 152/97 (BP Location: Right Arm, Patient Position: Sitting, Cuff Size: Normal)   Pulse 82   Ht 5\' 5"  (1.651 m)   Wt 139 lb (63 kg)   LMP 08/10/2023   Breastfeeding No   BMI 23.13 kg/m  UPT is negative Skin warm and dry. Neck: mid line trachea, normal thyroid, good ROM, no lymphadenopathy noted. Lungs: clear to ausculation bilaterally. Cardiovascular: regular rate and rhythm.    Pelvic: external genitalia is normal in appearance no lesions, vagina: +period blood,urethra has no lesions or masses noted, cervix:smooth and nulliparous, pap with GC/CHL and HR HPV genotyping performed, uterus: normal size, shape and contour, non tender, no masses felt, adnexa: no masses or tenderness noted. Bladder is non tender and no masses felt.  AA is 0 Fall risk is low    08/11/2023   10:14 AM  Depression screen PHQ 2/9  Decreased Interest 2  Down, Depressed, Hopeless 0  PHQ - 2 Score 2  Altered sleeping 0  Tired, decreased energy 1  Change in appetite 0  Feeling bad or failure about yourself  0   Trouble concentrating 0  Moving slowly or fidgety/restless 0  Suicidal thoughts 0  PHQ-9 Score 3       08/11/2023   10:14 AM  GAD 7 : Generalized Anxiety Score  Nervous, Anxious, on Edge 1  Control/stop worrying 3  Worry too much - different things 3  Trouble relaxing 0  Restless 1  Easily annoyed or irritable 1  Afraid - awful might happen 0  Total GAD 7 Score 9    Upstream - 08/11/23 0945       Pregnancy Intention Screening   Does the patient want to become pregnant in the next year? No    Does the patient's partner want to become pregnant in the next year? No      Contraception Wrap Up   Current Method Abstinence    End Method Abstinence              Examination chaperoned by Malachy Mood LPN  Assessment:     1. Pregnancy examination or test, negative result - POCT urine pregnancy  2. Routine Papanicolaou smear Pap sent, first pap Pap in 3 years if normal - Cytology - PAP( [facility])  3. Dysmenorrhea +painful periods for about 4 days Will get pelvic US - US PELVIC COMPLETE WITH TRANSVAGINAL; Future  4. Menorrhagia with regular cycle (Primary) Periods last about 7 days with 3-4 days  heavy, will change tampons every 2 hours Had labs with Dr Felecia Shelling few weeks ago was normal she says Will get pelvic US to assess uterus and ovaries Discussed POP, depo, IUD as possibilities to control periods,or even megace, depending on [place] results  - US PELVIC COMPLETE WITH TRANSVAGINAL; Future  5. Elevated BP without diagnosis of hypertension She says it is up because she does not coming to doctor Will recheck BP in about 2 weeks  6. Migraine with aura and without status migrainosus, not intractable  7. Anxiety +anxiety and would like meds Will rx buspar 5 mg 1 bid Meds ordered this encounter  Medications   busPIRone (BUSPAR) 5 MG tablet    Sig: Take 1 tablet (5 mg total) by mouth 2 (two) times daily.    Dispense:  60 tablet    Refill:  3    Supervising  Provider:   [name] [[id number]]       Plan:     Return 08/28/23 for pelvic US and see me after

## 2023-08-14 LAB — CYTOLOGY - PAP
Chlamydia: NEGATIVE
Comment: NEGATIVE
Comment: NEGATIVE
Comment: NORMAL
Diagnosis: NEGATIVE
High risk HPV: NEGATIVE
Neisseria Gonorrhea: NEGATIVE

## 2023-08-28 ENCOUNTER — Ambulatory Visit: Payer: BC Managed Care – PPO

## 2023-08-28 ENCOUNTER — Ambulatory Visit: Payer: BC Managed Care – PPO | Admitting: Adult Health

## 2023-08-28 ENCOUNTER — Encounter: Payer: Self-pay | Admitting: Adult Health

## 2023-08-28 VITALS — BP 130/86 | HR 74 | Ht 65.0 in | Wt 139.0 lb

## 2023-08-28 DIAGNOSIS — D219 Benign neoplasm of connective and other soft tissue, unspecified: Secondary | ICD-10-CM

## 2023-08-28 DIAGNOSIS — N920 Excessive and frequent menstruation with regular cycle: Secondary | ICD-10-CM | POA: Diagnosis not present

## 2023-08-28 DIAGNOSIS — G43109 Migraine with aura, not intractable, without status migrainosus: Secondary | ICD-10-CM

## 2023-08-28 DIAGNOSIS — N946 Dysmenorrhea, unspecified: Secondary | ICD-10-CM | POA: Diagnosis not present

## 2023-08-28 NOTE — Progress Notes (Addendum)
  Subjective:     Patient ID: Teresa Lane, female   DOB: [birth year], 38 y.o.   MRN: [id number]  HPI Teresa Lane is a 38 year old black female,single, G0P0 in to review US done today for heavy painful periods. Her periods last 7 days and are heavy for 3-4 days and has cramping for about 4 days. She says if on periods and has BM, will throw up. US showed 11.6 x 6.5 x 6.1 cm pedunculated anterior fibroid,, normal ovaries and EEC 11 mm.     Component Value Date/Time   DIAGPAP  08/11/2023 1003    - Negative for intraepithelial lesion or malignancy (NILM)   HPVHIGH Negative 08/11/2023 1003   ADEQPAP  08/11/2023 1003    Satisfactory for evaluation; transformation zone component PRESENT.    Review of Systems +heavy painful periods. Her periods last 7 days and are heavy for 3-4 days and has cramping for about 4 days. She says if on periods and has BM, will throw up Not having sex  On Buspar for about 2 weeks and feels less anxious  Reviewed past medical,surgical, social and family history. Reviewed medications and allergies.     Objective:   Physical Exam BP 130/86 (BP Location: Left Arm, Patient Position: Sitting, Cuff Size: Normal)   Pulse 74   Ht 5\' 5"  (1.651 m)   Wt 139 lb (63 kg)   LMP 08/10/2023   BMI 23.13 kg/m     Skin warm and dry.. Lungs: clear to ausculation bilaterally. Cardiovascular: regular rate and rhythm.    Assessment:     1. Menorrhagia with regular cycle Her periods last 7 days and are heavy for 3-4 days and has cramping for about 4 days. She says if on periods and has BM, will throw up Dicussed IUD  and hysterectomy, she is thinking hysterectomy her best option   2. Dysmenorrhea +cramping   3. Migraine with aura and without status migrainosus, not intractable  4. Fibroid  Has 11.6 x 6.5 x 6.1 cm ant. Pedunculated fibroid on [place]     Plan:     Return 09/10/23 to discuss possible surgery with Dr Despina Hidden

## 2023-08-28 NOTE — Progress Notes (Addendum)
 PELVIC US TA/TV: homogeneous anteverted uterus with a 11.6 x 6.5 x 6.1 cm anterior subserosal fibroid,EEC 11 mm,normal ovaries,no free fluid,no pain during ultrasound  Chaperone Whitney

## 2023-09-10 ENCOUNTER — Encounter: Payer: Self-pay | Admitting: Obstetrics & Gynecology

## 2023-09-10 ENCOUNTER — Ambulatory Visit (INDEPENDENT_AMBULATORY_CARE_PROVIDER_SITE_OTHER): Payer: BC Managed Care – PPO | Admitting: Obstetrics & Gynecology

## 2023-09-10 VITALS — BP 138/89 | HR 78 | Ht 65.0 in | Wt 139.0 lb

## 2023-09-10 DIAGNOSIS — R102 Pelvic and perineal pain: Secondary | ICD-10-CM | POA: Diagnosis not present

## 2023-09-10 DIAGNOSIS — F17210 Nicotine dependence, cigarettes, uncomplicated: Secondary | ICD-10-CM

## 2023-09-10 DIAGNOSIS — N946 Dysmenorrhea, unspecified: Secondary | ICD-10-CM | POA: Diagnosis not present

## 2023-09-10 DIAGNOSIS — D219 Benign neoplasm of connective and other soft tissue, unspecified: Secondary | ICD-10-CM | POA: Diagnosis not present

## 2023-09-10 DIAGNOSIS — N920 Excessive and frequent menstruation with regular cycle: Secondary | ICD-10-CM

## 2023-09-10 NOTE — Progress Notes (Signed)
 Follow up appointment for results: Pelvic sonogram  Chief Complaint  Patient presents with   Follow-up    Wants to discuss hysterectomy to manage menorrhagia    Blood pressure 138/89, pulse 78, height 5\' 5"  (1.651 m), weight 139 lb (63 kg), last menstrual period 08/10/2023.  US PELVIC COMPLETE WITH TRANSVAGINAL Result Date: 08/29/2023 Images from the original result were not included.  ..an [employer] of Ultrasound Medicine Technical sales engineer) accredited practice [facility] [address] [place] [address] Ordering Provider: Adline Potter, NP                                                                                                                                   GYNECOLOGIC SONOGRAM Teresa Lane is a 38 y.o. G0P0 Patient's last menstrual period was 08/10/2023. She is here for a pelvic sonogram for dysmenorrhea/menorrhagia. Uterus                      10.1 x 7.4 x 10 cm, Total uterine volume 392 cc,homogeneous anteverted uterus with a 11.6 x 6.5 x 6.1 cm anterior subserosal fibroid Endometrium          11 mm, symmetrical, WNL Right ovary             3.1 x 1.6 x 3.9 cm,normal Left ovary                2.3 x 1.2 x 2.1 cm, normal No free fluid Technician Comments: PELVIC US TA/TV: homogeneous anteverted uterus with a 11.6 x 6.5 x 6.1 cm anterior subserosal fibroid,EEC 11 mm,normal ovaries,no free fluid,no pain during ultrasound Chaperone [id number] Flora Lipps 08/28/2023 11:32 AM Clinical Impression and recommendations: I have reviewed the sonogram results above, combined with the patient's current clinical course, below are my impressions and any appropriate recommendations for management based on the sonographic findings. Uterus enlarged due to large 12 cm anterior fibroid Endometrium normal for secretory phase of cycle Ovaries: both are normal size shape and contour [name] 08/29/2023 5:13 PM    DG Chest 2 View Result Date: 07/29/2023 CLINICAL  DATA:  Cough.  Smoker. EXAM: CHEST - 2 VIEW COMPARISON:  06/22/2011 FINDINGS: The heart size and mediastinal contours are within normal limits. Both lungs are clear. The visualized skeletal structures are unremarkable. IMPRESSION: No active cardiopulmonary disease. Electronically Signed   By: Danae Orleans M.D.   On: 07/29/2023 09:33    MEDS ordered this encounter: No orders of the defined types were placed in this encounter.   Orders for this encounter: No orders of the defined types were placed in this encounter.   Impression + Management Plan   ICD-10-CM   1. Fibroid, 11 cm  D21.9     2. Menorrhagia with regular cycle  N92.0     3. Dysmenorrhea  N94.6     4. Pelvic pain  R10.2     5. Cigarette smoker: increased risk of post op vaginal dehiscence  F17.210       Follow Up: Return in about 2 months (around 11/13/2023) for Post Op, with Dr Despina Hidden.     All questions were answered.  Past Medical History:  Diagnosis Date   Migraines     Past Surgical History:  Procedure Laterality Date   HERNIA REPAIR      OB History     Gravida  0   Para      Term      Preterm      AB      Living         SAB      IAB      Ectopic      Multiple      Live Births              Allergies  Allergen Reactions   Penicillins Rash    Social History   Socioeconomic History   Marital status: Single    Spouse name: Not on file   Number of children: Not on file   Years of education: Not on file   Highest education level: Not on file  Occupational History   Not on file  Tobacco Use   Smoking status: Every Day    Current packs/day: 1.00    Types: Cigarettes   Smokeless tobacco: Never  Vaping Use   Vaping status: Never Used  Substance and Sexual Activity   Alcohol use: Not Currently    Comment: occasionally   Drug use: Yes    Types: Marijuana    Comment: occ   Sexual activity: Not Currently    Birth control/protection: None, Abstinence  Other Topics  Concern   Not on file  Social History Narrative   Not on file   Social Drivers of Health   Financial Resource Strain: Low Risk  (08/11/2023)   Overall Financial Resource Strain (CARDIA)    Difficulty of Paying Living Expenses: Not hard at all  Food Insecurity: No Food Insecurity (08/11/2023)   Hunger Vital Sign    Worried About Running Out of Food in the Last Year: Never true    Ran Out of Food in the Last Year: Never true  Transportation Needs: No Transportation Needs (08/11/2023)   PRAPARE - Administrator, Civil Service (Medical): No    Lack of Transportation (Non-Medical): No  Physical Activity: Sufficiently Active (08/11/2023)   Exercise Vital Sign    Days of Exercise per Week: 5 days    Minutes of Exercise per Session: 30 min  Stress: Stress Concern Present (08/11/2023)   [employer] of Occupational Health - Occupational Stress Questionnaire    Feeling of Stress : Very much  Social Connections: Unknown (08/11/2023)   Social Connection and Isolation Panel [NHANES]    Frequency of Communication with Friends and Family: Once a week    Frequency of Social Gatherings with Friends and Family: Patient declined    Attends Religious Services: Never    Database administrator or Organizations: No    Attends Engineer, structural: Never    Marital Status: Patient declined    Family History  Problem Relation Age of Onset   Diabetes Maternal Grandmother    Diabetes Mother

## 2023-10-29 ENCOUNTER — Other Ambulatory Visit: Payer: Self-pay | Admitting: Obstetrics & Gynecology

## 2023-10-29 DIAGNOSIS — Z01818 Encounter for other preprocedural examination: Secondary | ICD-10-CM

## 2023-10-29 NOTE — Patient Instructions (Signed)
 Teresa Lane  10/29/2023     @PREFPERIOPPHARMACY @   Your procedure is scheduled on Tuesday, May 6.  Report to [facility] at 0600 A.M.  Call this number if you have problems the morning of surgery:  [phone]  If you experience any cold or flu symptoms such as cough, fever, chills, shortness of breath, etc. between now and your scheduled surgery, please notify us  at the above number.   Remember:  Do not eat solid food after midnight.  You may drink clear liquids until 0330 .  Clear liquids allowed are:                    Water, Juice (No red color; non-citric and without pulp; diabetics please choose diet or no sugar options), Carbonated beverages (diabetics please choose diet or no sugar options), Clear Tea (No creamer, milk, or cream, including half & half and powdered creamer), and Black Coffee Only (No creamer, milk or cream, including half & half and powdered creamer)    Take these medicines the morning of surgery with A SIP OF WATER buspar     Do not wear jewelry, make-up or nail polish, including gel polish,  artificial nails, or any other type of covering on natural nails (fingers and  toes).  Do not wear lotions, powders, or perfumes, or deodorant.  Do not shave 48 hours prior to surgery.  Men may shave face and neck.  Do not bring valuables to the hospital.  [facility] is not responsible for any belongings or valuables.  Contacts, dentures or bridgework may not be worn into surgery.  Leave your suitcase in the car.  After surgery it may be brought to your room.  For patients admitted to the hospital, discharge time will be determined by your treatment team.  Patients discharged the day of surgery will not be allowed to drive home.   Name and phone number of your driver:   driver Special instructions:  Drink carb loading drink at  3:00 am  Please read over the following fact sheets that you were given. Pain Booklet, Coughing and Deep Breathing, Surgical Site  Infection Prevention, Anesthesia Post-op Instructions, and Care and Recovery After Surgery      How to Use Chlorhexidine at Home in the Shower Chlorhexidine gluconate (CHG) is a germ-killing (antiseptic) wash that's used to clean the skin. It can get rid of the germs that normally live on the skin and can keep them away for about 24 hours. If you're having surgery, you may be told to shower with CHG at home the night before surgery. This can help lower your risk for infection. To use CHG wash in the shower, follow the steps below. Supplies needed: CHG body wash. Clean washcloth. Clean towel. How to use CHG in the shower Follow these steps unless you're told to use CHG in a different way: Start the shower. Use your normal soap and shampoo to wash your face and hair. Turn off the shower or move out of the shower stream. Pour CHG onto a clean washcloth. Do not use any type of brush or rough sponge. Start at your neck, washing your body down to your toes. Make sure you: Wash the part of your body where the surgery will be done for at least 1 minute. Do not scrub. Do not use CHG on your head or face unless your health care provider tells you to. If it gets into your ears or eyes, rinse them well  with water. Do not wash your genitals with CHG. Wash your back and under your arms. Make sure to wash skin folds. Let the CHG sit on your skin for 1-2 minutes or as long as told. Rinse your entire body in the shower, including all body creases and folds. Turn off the shower. Dry off with a clean towel. Do not put anything on your skin afterward, such as powder, lotion, or perfume. Put on clean clothes or pajamas. If it's the night before surgery, sleep in clean sheets. General tips Use CHG only as told, and follow the instructions on the label. Use the full amount of CHG as told. This is often one bottle. Do not smoke and stay away from flames after using CHG. Your skin may feel sticky after  using CHG. This is normal. The sticky feeling will go away as the CHG dries. Do not use CHG: If you have a chlorhexidine allergy or have reacted to chlorhexidine in the past. On open wounds or areas of skin that have broken skin, cuts, or scrapes. On babies younger than 40 months of age. Contact a health care provider if: You have questions about using CHG. Your skin gets irritated or itchy. You have a rash after using CHG. You swallow any CHG. Call your local poison control center [phone] in the [place] Your eyes itch badly, or they become very red or swollen. Your hearing changes. You have trouble seeing. If you can't reach your provider, go to an urgent care or emergency room. Do not drive yourself. Get help right away if: You have swelling or tingling in your mouth or throat. You make high-pitched whistling sounds when you breathe, most often when you breathe out (wheeze). You have trouble breathing. These symptoms may be an emergency. Call 911 right away. Do not wait to see if the symptoms will go away. Do not drive yourself to the hospital. This information is not intended to replace advice given to you by your health care provider. Make sure you discuss any questions you have with your health care provider. Document Revised: 12/30/2022 Document Reviewed: 12/26/2021 Elsevier Patient Education  2024 [employer].Laparoscopically Assisted Vaginal Hysterectomy  A laparoscopic assisted vaginal hysterectomy (LAVH) is a surgery to remove the uterus. The surgery is minimally invasive. This means that very small incisions are made, instead of one large incision. This surgery can be done with or without a robot. During this surgery, your fallopian tubes and ovaries may also be taken out. This surgery may be done if you have: Growths in the uterus, such as fibroids. This is the most common reason. Endometriosis. This is when the lining of the uterus is growing outside of the  uterus. Pelvic organ prolapse. This is when the uterus has moved down and bulges into the vagina. Cancer of the uterus or cervix. Abnormal or heavy bleeding during your period. Long-term (chronic) pain in the pelvis. After this surgery, you'll no longer be able to have a baby, and you'll no longer have a period. Tell a health care provider about: Any allergies you have. All medicines you're taking. These include vitamins, herbs, eye drops, creams, and over-the-counter medicines. Any problems you or family members have had with anesthesia. Any bleeding problems you have. Any surgeries you've had. Any medical conditions you have. Whether you're pregnant or may be pregnant. What are the risks? Your health care provider will talk with you about risks. These may include: Bleeding or blood clots in the legs or lungs. Infection.  Damage to nearby structures or organs, including nerve, bladder, or bowel injury. Reaction to the medicines used. Early symptoms of menopause. You may have hot flashes, mood swings, dryness of the vagina, and low estrogen if both ovaries are removed. What happens before the surgery? When to stop eating and drinking Follow instructions from your provider about what you may eat and drink. These may include: 8 hours before your surgery Stop eating most foods. Do not eat meat, fried foods, or fatty foods. Eat only light foods, such as toast or crackers. All liquids are okay except energy drinks and alcohol. 6 hours before your surgery Stop eating. Drink only clear liquids, such as water, clear fruit juice, black coffee, plain tea, and sports drinks. Do not drink energy drinks or alcohol. 2 hours before your surgery Stop drinking all liquids. You may be allowed to take medicines with small sips of water. If you don't follow your provider's instructions, your surgery may be delayed or canceled. Medicines Ask your provider about: Changing or stopping your regular  medicines. These include any diabetes medicines or blood thinners you take. Taking medicines, such as aspirin and ibuprofen . These medicines can thin your blood. Do not take them unless you are told to. Vitamins, herbs, and supplements. You may be asked to take a medicine to empty your colon (bowel prep). Surgery safety For your safety, your provider may: Remove hair at the surgery site. Ask you to wash with a soap that kills germs. Give you antibiotics. General instructions If you were asked to do a bowel prep before the surgery, do it as told by your provider. Do not use any products that contain nicotine or tobacco for at least 4 weeks before the surgery. These products include cigarettes, chewing tobacco, and vaping devices, such as e-cigarettes. If you need help quitting, ask your provider. What happens during the surgery? You will be given: A sedative. This helps you relax. Anesthesia. This keeps you from feeling pain. It will make you fall asleep. Your surgeon will make several small cuts, or incisions, in your belly. Tools for surgery will be inserted into the small incisions. The surgeon will take out your uterus through the vagina. Your small incisions will be closed with stitches, skin glue, or tape strips. This surgery may vary among providers and hospitals. What happens after the surgery? Your blood pressure, heart rate, breathing rate, and blood oxygen level will be monitored until you leave the hospital. You will be given pain medicine as needed. You will have a soft tube, or catheter, in place for a few hours. It will likely be removed the day after surgery. You will wear a pad because you will have bleeding and discharge from the vagina. Where to find more information [employer]: acog.org This information is not intended to replace advice given to you by your health care provider. Make sure you discuss any questions you have with  your health care provider. Document Revised: 09/26/2022 Document Reviewed: 09/26/2022 Elsevier Patient Education  2024 [employer].

## 2023-10-30 ENCOUNTER — Encounter (HOSPITAL_COMMUNITY): Payer: Self-pay

## 2023-10-30 ENCOUNTER — Encounter (HOSPITAL_COMMUNITY)
Admission: RE | Admit: 2023-10-30 | Discharge: 2023-10-30 | Disposition: A | Discharge status: Home / Self Care | Source: Ambulatory Visit | Attending: Obstetrics & Gynecology | Admitting: Obstetrics & Gynecology

## 2023-10-30 ENCOUNTER — Other Ambulatory Visit: Payer: Self-pay

## 2023-10-30 DIAGNOSIS — Z01812 Encounter for preprocedural laboratory examination: Secondary | ICD-10-CM | POA: Diagnosis not present

## 2023-10-30 DIAGNOSIS — Z01818 Encounter for other preprocedural examination: Secondary | ICD-10-CM

## 2023-10-30 HISTORY — DX: Anxiety disorder, unspecified: F41.9

## 2023-10-30 LAB — URINALYSIS, ROUTINE W REFLEX MICROSCOPIC
Bacteria, UA: NONE SEEN
Bilirubin Urine: NEGATIVE
Glucose, UA: NEGATIVE mg/dL
Ketones, ur: NEGATIVE mg/dL
Leukocytes,Ua: NEGATIVE
Nitrite: NEGATIVE
Protein, ur: 30 mg/dL — AB
Specific Gravity, Urine: 1.028 (ref 1.005–1.030)
pH: 5 (ref 5.0–8.0)

## 2023-10-30 LAB — CBC
HCT: 39.7 % (ref 36.0–46.0)
Hemoglobin: 13.7 g/dL (ref 12.0–15.0)
MCH: 31.7 pg (ref 26.0–34.0)
MCHC: 34.5 g/dL (ref 30.0–36.0)
MCV: 91.9 fL (ref 80.0–100.0)
Platelets: 300 10*3/uL (ref 150–400)
RBC: 4.32 MIL/uL (ref 3.87–5.11)
RDW: 12.8 % (ref 11.5–15.5)
WBC: 5.9 10*3/uL (ref 4.0–10.5)
nRBC: 0 % (ref 0.0–0.2)

## 2023-10-30 LAB — COMPREHENSIVE METABOLIC PANEL WITH GFR
ALT: 13 U/L (ref 0–44)
AST: 17 U/L (ref 15–41)
Albumin: 4.1 g/dL (ref 3.5–5.0)
Alkaline Phosphatase: 53 U/L (ref 38–126)
Anion gap: 8 (ref 5–15)
BUN: 10 mg/dL (ref 6–20)
CO2: 22 mmol/L (ref 22–32)
Calcium: 9.2 mg/dL (ref 8.9–10.3)
Chloride: 104 mmol/L (ref 98–111)
Creatinine, Ser: 0.66 mg/dL (ref 0.44–1.00)
GFR, Estimated: >60 mL/min (ref >60)
Glucose, Bld: 87 mg/dL (ref 70–99)
Potassium: 3.7 mmol/L (ref 3.5–5.1)
Sodium: 134 mmol/L — ABNORMAL LOW (ref 135–145)
Total Bilirubin: 0.3 mg/dL (ref 0.0–1.2)
Total Protein: 7.4 g/dL (ref 6.5–8.1)

## 2023-10-30 LAB — TYPE AND SCREEN
ABO/RH(D): A POS
Antibody Screen: NEGATIVE

## 2023-10-30 LAB — RAPID HIV SCREEN (HIV 1/2 AB+AG)
HIV 1/2 Antibodies: NONREACTIVE
HIV-1 P24 Antigen - HIV24: NONREACTIVE

## 2023-10-30 LAB — PREGNANCY, URINE: Preg Test, Ur: NEGATIVE

## 2023-11-03 ENCOUNTER — Ambulatory Visit (HOSPITAL_COMMUNITY): Payer: Self-pay | Admitting: Anesthesiology

## 2023-11-03 ENCOUNTER — Other Ambulatory Visit: Payer: Self-pay

## 2023-11-03 ENCOUNTER — Encounter (HOSPITAL_COMMUNITY)
Admission: RE | Disposition: A | Discharge status: Home / Self Care | Payer: Self-pay | Source: Home / Self Care | Attending: Obstetrics & Gynecology

## 2023-11-03 ENCOUNTER — Encounter (HOSPITAL_COMMUNITY): Payer: Self-pay | Admitting: Obstetrics & Gynecology

## 2023-11-03 ENCOUNTER — Ambulatory Visit (HOSPITAL_COMMUNITY)
Admission: RE | Admit: 2023-11-03 | Discharge: 2023-11-03 | Disposition: A | Discharge status: Home / Self Care | Attending: Obstetrics & Gynecology | Admitting: Obstetrics & Gynecology

## 2023-11-03 DIAGNOSIS — D251 Intramural leiomyoma of uterus: Secondary | ICD-10-CM | POA: Diagnosis not present

## 2023-11-03 DIAGNOSIS — N920 Excessive and frequent menstruation with regular cycle: Secondary | ICD-10-CM

## 2023-11-03 DIAGNOSIS — R102 Pelvic and perineal pain: Secondary | ICD-10-CM

## 2023-11-03 DIAGNOSIS — N946 Dysmenorrhea, unspecified: Secondary | ICD-10-CM

## 2023-11-03 DIAGNOSIS — Z302 Encounter for sterilization: Secondary | ICD-10-CM | POA: Insufficient documentation

## 2023-11-03 DIAGNOSIS — N838 Other noninflammatory disorders of ovary, fallopian tube and broad ligament: Secondary | ICD-10-CM | POA: Insufficient documentation

## 2023-11-03 DIAGNOSIS — I10 Essential (primary) hypertension: Secondary | ICD-10-CM | POA: Insufficient documentation

## 2023-11-03 DIAGNOSIS — N72 Inflammatory disease of cervix uteri: Secondary | ICD-10-CM

## 2023-11-03 DIAGNOSIS — D259 Leiomyoma of uterus, unspecified: Secondary | ICD-10-CM

## 2023-11-03 DIAGNOSIS — F17210 Nicotine dependence, cigarettes, uncomplicated: Secondary | ICD-10-CM | POA: Diagnosis not present

## 2023-11-03 DIAGNOSIS — D219 Benign neoplasm of connective and other soft tissue, unspecified: Secondary | ICD-10-CM

## 2023-11-03 HISTORY — PX: ROBOTIC ASSISTED TOTAL HYSTERECTOMY WITH BILATERAL SALPINGO OOPHERECTOMY: SHX6086

## 2023-11-03 SURGERY — HYSTERECTOMY, TOTAL, ROBOT-ASSISTED, LAPAROSCOPIC, WITH BILATERAL SALPINGO-OOPHORECTOMY
Anesthesia: General | Site: Abdomen

## 2023-11-03 MED ORDER — FENTANYL CITRATE (PF) 100 MCG/2ML IJ SOLN
INTRAMUSCULAR | Status: AC
  Filled 2023-11-03: qty 2

## 2023-11-03 MED ORDER — KETOROLAC TROMETHAMINE 30 MG/ML IJ SOLN
30.0000 mg | INTRAMUSCULAR | Status: AC
  Administered 2023-11-03: 30 mg via INTRAVENOUS

## 2023-11-03 MED ORDER — DEXAMETHASONE SODIUM PHOSPHATE 10 MG/ML IJ SOLN
INTRAMUSCULAR | Status: DC | PRN
  Administered 2023-11-03: 5 mg via INTRAVENOUS

## 2023-11-03 MED ORDER — STERILE WATER FOR IRRIGATION IR SOLN
Status: DC | PRN
  Administered 2023-11-03: 500 mL

## 2023-11-03 MED ORDER — LACTATED RINGERS IV SOLN: INTRAVENOUS | Status: DC

## 2023-11-03 MED ORDER — OXYCODONE-ACETAMINOPHEN 7.5-325 MG PO TABS: 1.0000 | ORAL_TABLET | Freq: Four times a day (QID) | ORAL | 0 refills | Status: AC | PRN

## 2023-11-03 MED ORDER — ORAL CARE MOUTH RINSE: 15.0000 mL | Freq: Once | OROMUCOSAL | Status: AC

## 2023-11-03 MED ORDER — FENTANYL CITRATE (PF) 100 MCG/2ML IJ SOLN
INTRAMUSCULAR | Status: AC
Start: 2023-11-03 — End: ?
  Filled 2023-11-03: qty 2

## 2023-11-03 MED ORDER — BUPIVACAINE HCL 0.25 % IJ SOLN
INTRAMUSCULAR | Status: DC | PRN
  Administered 2023-11-03: 40 mL

## 2023-11-03 MED ORDER — DEXAMETHASONE SODIUM PHOSPHATE 10 MG/ML IJ SOLN
INTRAMUSCULAR | Status: AC
  Filled 2023-11-03: qty 1

## 2023-11-03 MED ORDER — LIDOCAINE 2% (20 MG/ML) 5 ML SYRINGE
INTRAMUSCULAR | Status: DC | PRN
  Administered 2023-11-03: 60 mg via INTRAVENOUS

## 2023-11-03 MED ORDER — OXYCODONE HCL 5 MG PO TABS: 5.0000 mg | ORAL_TABLET | Freq: Once | ORAL | Status: AC | PRN

## 2023-11-03 MED ORDER — ROCURONIUM BROMIDE 10 MG/ML (PF) SYRINGE
PREFILLED_SYRINGE | INTRAVENOUS | Status: DC | PRN
  Administered 2023-11-03: 20 mg via INTRAVENOUS
  Administered 2023-11-03: 60 mg via INTRAVENOUS
  Administered 2023-11-03: 20 mg via INTRAVENOUS

## 2023-11-03 MED ORDER — LIDOCAINE 2% (20 MG/ML) 5 ML SYRINGE
INTRAMUSCULAR | Status: AC
  Filled 2023-11-03: qty 5

## 2023-11-03 MED ORDER — FENTANYL CITRATE (PF) 100 MCG/2ML IJ SOLN
INTRAMUSCULAR | Status: DC | PRN
  Administered 2023-11-03: 75 ug via INTRAVENOUS
  Administered 2023-11-03 (×3): 25 ug via INTRAVENOUS
  Administered 2023-11-03: 50 ug via INTRAVENOUS
  Administered 2023-11-03: 100 ug via INTRAVENOUS

## 2023-11-03 MED ORDER — KETOROLAC TROMETHAMINE 30 MG/ML IJ SOLN
INTRAMUSCULAR | Status: AC
  Filled 2023-11-03: qty 1

## 2023-11-03 MED ORDER — LABETALOL HCL 5 MG/ML IV SOLN
INTRAVENOUS | Status: AC
  Filled 2023-11-03: qty 4

## 2023-11-03 MED ORDER — FENTANYL CITRATE PF 50 MCG/ML IJ SOSY
25.0000 ug | PREFILLED_SYRINGE | INTRAMUSCULAR | Status: DC | PRN
  Administered 2023-11-03 (×2): 50 ug via INTRAVENOUS
  Filled 2023-11-03 (×2): qty 1

## 2023-11-03 MED ORDER — MIDAZOLAM HCL 2 MG/2ML IJ SOLN
INTRAMUSCULAR | Status: AC
  Filled 2023-11-03: qty 2

## 2023-11-03 MED ORDER — CEFAZOLIN SODIUM-DEXTROSE 2-4 GM/100ML-% IV SOLN
2.0000 g | INTRAVENOUS | Status: AC
  Administered 2023-11-03: 2 g via INTRAVENOUS

## 2023-11-03 MED ORDER — ONDANSETRON HCL 4 MG/2ML IJ SOLN
INTRAMUSCULAR | Status: DC | PRN
  Administered 2023-11-03: 4 mg via INTRAVENOUS

## 2023-11-03 MED ORDER — KETOROLAC TROMETHAMINE 10 MG PO TABS: 10.0000 mg | ORAL_TABLET | Freq: Three times a day (TID) | ORAL | 0 refills | Status: DC | PRN

## 2023-11-03 MED ORDER — BUPIVACAINE HCL (PF) 0.25 % IJ SOLN
INTRAMUSCULAR | Status: AC
  Filled 2023-11-03: qty 60

## 2023-11-03 MED ORDER — ONDANSETRON HCL 4 MG/2ML IJ SOLN
INTRAMUSCULAR | Status: AC
  Filled 2023-11-03: qty 2

## 2023-11-03 MED ORDER — BUPIVACAINE HCL (PF) 0.25 % IJ SOLN
INTRAMUSCULAR | Status: AC
  Filled 2023-11-03: qty 30

## 2023-11-03 MED ORDER — LACTATED RINGERS IV SOLN: INTRAVENOUS | Status: DC | PRN

## 2023-11-03 MED ORDER — MIDAZOLAM HCL 2 MG/2ML IJ SOLN
INTRAMUSCULAR | Status: DC | PRN
  Administered 2023-11-03: 2 mg via INTRAVENOUS

## 2023-11-03 MED ORDER — POVIDONE-IODINE 10 % EX SWAB
2.0000 | Freq: Once | CUTANEOUS | Status: DC
Start: 2023-11-03 — End: 2023-11-03

## 2023-11-03 MED ORDER — ONDANSETRON 8 MG PO TBDP: 8.0000 mg | ORAL_TABLET | Freq: Three times a day (TID) | ORAL | 0 refills | Status: DC | PRN

## 2023-11-03 MED ORDER — CEFAZOLIN SODIUM-DEXTROSE 2-4 GM/100ML-% IV SOLN
INTRAVENOUS | Status: AC
  Filled 2023-11-03: qty 100

## 2023-11-03 MED ORDER — PROPOFOL 10 MG/ML IV BOLUS
INTRAVENOUS | Status: DC | PRN
  Administered 2023-11-03 (×2): 20 mg via INTRAVENOUS
  Administered 2023-11-03: 160 mg via INTRAVENOUS

## 2023-11-03 MED ORDER — ROCURONIUM BROMIDE 10 MG/ML (PF) SYRINGE
PREFILLED_SYRINGE | INTRAVENOUS | Status: AC
  Filled 2023-11-03: qty 10

## 2023-11-03 MED ORDER — CHLORHEXIDINE GLUCONATE 0.12 % MT SOLN: 15.0000 mL | Freq: Once | OROMUCOSAL | Status: AC

## 2023-11-03 MED ORDER — CHLORHEXIDINE GLUCONATE 0.12 % MT SOLN
15.0000 mL | Freq: Once | OROMUCOSAL | Status: AC
  Administered 2023-11-03: 15 mL via OROMUCOSAL

## 2023-11-03 MED ORDER — OXYCODONE HCL 5 MG/5ML PO SOLN
5.0000 mg | Freq: Once | ORAL | Status: AC | PRN
  Administered 2023-11-03: 5 mg via ORAL
  Filled 2023-11-03: qty 5

## 2023-11-03 MED ORDER — ONDANSETRON HCL 4 MG/2ML IJ SOLN: 4.0000 mg | Freq: Once | INTRAMUSCULAR | Status: DC | PRN

## 2023-11-03 MED ORDER — SUGAMMADEX SODIUM 200 MG/2ML IV SOLN
INTRAVENOUS | Status: DC | PRN
  Administered 2023-11-03: 200 mg via INTRAVENOUS

## 2023-11-03 MED ORDER — LABETALOL HCL 5 MG/ML IV SOLN
INTRAVENOUS | Status: DC | PRN
  Administered 2023-11-03: 5 mg via INTRAVENOUS
  Administered 2023-11-03 (×2): 2.5 mg via INTRAVENOUS

## 2023-11-03 MED ORDER — CIPROFLOXACIN HCL 500 MG PO TABS: 500.0000 mg | ORAL_TABLET | Freq: Two times a day (BID) | ORAL | 0 refills | Status: DC

## 2023-11-03 SURGICAL SUPPLY — 50 items
APPLICATOR COTTON TIP 6 STRL (MISCELLANEOUS) ×2 IMPLANT
APPLICATOR COTTON TIP 6IN STRL (MISCELLANEOUS) ×1 IMPLANT
BLADE SURG SZ11 CARB STEEL (BLADE) ×1 IMPLANT
CAUTERY HOOK MNPLR 1.6 DVNC XI (INSTRUMENTS) ×2 IMPLANT
COVER LIGHT HANDLE STERIS (MISCELLANEOUS) ×4 IMPLANT
COVER MAYO STAND XLG (MISCELLANEOUS) ×1 IMPLANT
DERMABOND ADVANCED .7 DNX12 (GAUZE/BANDAGES/DRESSINGS) ×1 IMPLANT
DRAPE ARM DVNC X/XI (DISPOSABLE) ×8 IMPLANT
DRAPE COLUMN DVNC XI (DISPOSABLE) ×1 IMPLANT
ELECTRODE REM PT RTRN 9FT ADLT (ELECTROSURGICAL) ×1 IMPLANT
FORCEPS PROGRASP DVNC XI (FORCEP) ×1 IMPLANT
GAUZE 4X4 16PLY ~~LOC~~+RFID DBL (SPONGE) ×2 IMPLANT
GLOVE BIOGEL PI IND STRL 7.0 (GLOVE) ×4 IMPLANT
GLOVE BIOGEL PI IND STRL 7.5 (GLOVE) IMPLANT
GLOVE BIOGEL PI IND STRL 8 (GLOVE) ×2 IMPLANT
GLOVE ECLIPSE 8.0 STRL XLNG CF (GLOVE) ×3 IMPLANT
GLOVE SURG SS PI 7.5 STRL IVOR (GLOVE) IMPLANT
GOWN STRL REUS W/TWL LRG LVL3 (GOWN DISPOSABLE) ×2 IMPLANT
GOWN STRL REUS W/TWL XL LVL3 (GOWN DISPOSABLE) ×2 IMPLANT
KIT PINK PAD W/HEAD ARE REST (MISCELLANEOUS) ×1 IMPLANT
KIT PINK PAD W/HEAD ARM REST (MISCELLANEOUS) ×1 IMPLANT
KIT TURNOVER CYSTO (KITS) ×1 IMPLANT
MANIFOLD NEPTUNE II (INSTRUMENTS) ×1 IMPLANT
NDL HYPO 21X1.5 SAFETY (NEEDLE) ×1 IMPLANT
NDL INSUFFLATION 14GA 120MM (NEEDLE) ×1 IMPLANT
NEEDLE HYPO 21X1.5 SAFETY (NEEDLE) ×1 IMPLANT
NEEDLE INSUFFLATION 14GA 120MM (NEEDLE) ×1 IMPLANT
OBTURATOR OPTICALSTD 8 DVNC (TROCAR) ×1 IMPLANT
PACK PERI GYN (CUSTOM PROCEDURE TRAY) ×1 IMPLANT
RUMI II GYRUS 2.5CM BLUE (DISPOSABLE) IMPLANT
RUMI II GYRUS 3.5CM BLUE (DISPOSABLE) IMPLANT
SEAL UNIV 5-12 XI (MISCELLANEOUS) ×3 IMPLANT
SEALER VESSEL EXT DVNC XI (MISCELLANEOUS) ×1 IMPLANT
SET BASIN LINEN APH (SET/KITS/TRAYS/PACK) ×1 IMPLANT
SET IRRIG TUBING LAPAROSCOPIC (IRRIGATION / IRRIGATOR) ×1 IMPLANT
SET TRI-LUMEN FLTR TB AIRSEAL (TUBING) ×1 IMPLANT
SET TUBE IRRIG SUCTION NO TIP (IRRIGATION / IRRIGATOR) ×1 IMPLANT
SOLUTION ANTFG W/FOAM PAD STRL (MISCELLANEOUS) ×2 IMPLANT
SPONGE T-LAP 18X18 ~~LOC~~+RFID (SPONGE) ×1 IMPLANT
SUT VICRYL 0 AB UR-6 (SUTURE) ×2 IMPLANT
SUT VICRYL AB 3-0 FS1 BRD 27IN (SUTURE) ×2 IMPLANT
SUTURE STRATFX 0 PDS+ CT-2 23 (SUTURE) ×1 IMPLANT
SYR 10ML LL (SYRINGE) ×2 IMPLANT
SYR 50ML LL SCALE MARK (SYRINGE) ×2 IMPLANT
SYR CONTROL 10ML LL (SYRINGE) ×2 IMPLANT
TIP UTERINE 6.7X8CM BLUE DISP (MISCELLANEOUS) IMPLANT
TRAY FOLEY SLVR 16FR LF STAT (SET/KITS/TRAYS/PACK) ×1 IMPLANT
TROCAR KII 8X100ML NONTHREADED (TROCAR) ×1 IMPLANT
TROCAR PORT AIRSEAL 8X120 (TROCAR) ×1 IMPLANT
WATER STERILE IRR 500ML POUR (IV SOLUTION) ×1 IMPLANT

## 2023-11-03 NOTE — Anesthesia Procedure Notes (Signed)
 Procedure Name: Intubation Date/Time: 11/03/2023 7:51 AM  Performed by: Leeanne Puffer, CRNAPre-anesthesia Checklist: Patient identified, Patient being monitored, Timeout performed, Emergency Drugs available and Suction available Patient Re-evaluated:Patient Re-evaluated prior to induction Oxygen Delivery Method: Circle System Utilized Preoxygenation: Pre-oxygenation with 100% oxygen Induction Type: IV induction Ventilation: Mask ventilation without difficulty Laryngoscope Size: Mac and 3 Grade View: Grade I Tube type: Oral Tube size: 7.0 mm Number of attempts: 1 Airway Equipment and Method: stylet Placement Confirmation: ETT inserted through vocal cords under direct vision, positive ETCO2 and breath sounds checked- equal and bilateral Secured at: 21 cm Tube secured with: Tape Dental Injury: Teeth and Oropharynx as per pre-operative assessment

## 2023-11-03 NOTE — Anesthesia Preprocedure Evaluation (Signed)
 Anesthesia Evaluation  Patient identified by MRN, date of birth, ID band Patient awake    Reviewed: Allergy & Precautions, H&P , NPO status , Patient's Chart, lab work & pertinent test results, reviewed documented beta blocker date and time   Airway Mallampati: II  TM Distance: >3 FB Neck ROM: full    Dental no notable dental hx.    Pulmonary neg pulmonary ROS, Current Smoker and Patient abstained from smoking.   Pulmonary exam normal breath sounds clear to auscultation       Cardiovascular Exercise Tolerance: Good hypertension, negative cardio ROS  Rhythm:regular Rate:Normal     Neuro/Psych negative neurological ROS  negative psych ROS   GI/Hepatic negative GI ROS, Neg liver ROS,,,  Endo/Other  negative endocrine ROS    Renal/GU negative Renal ROS  negative genitourinary   Musculoskeletal   Abdominal   Peds  Hematology negative hematology ROS (+)   Anesthesia Other Findings   Reproductive/Obstetrics negative OB ROS                             Anesthesia Physical Anesthesia Plan  ASA: 2  Anesthesia Plan: General and General ETT   Post-op Pain Management:    Induction:   PONV Risk Score and Plan: Ondansetron  Airway Management Planned:   Additional Equipment:   Intra-op Plan:   Post-operative Plan:   Informed Consent: I have reviewed the patients History and Physical, chart, labs and discussed the procedure including the risks, benefits and alternatives for the proposed anesthesia with the patient or authorized representative who has indicated his/her understanding and acceptance.     Dental Advisory Given  Plan Discussed with: CRNA  Anesthesia Plan Comments:        Anesthesia Quick Evaluation

## 2023-11-03 NOTE — H&P (Signed)
 Preoperative History and Physical  Teresa Lane is a 38 y.o. G0P0 with Patient's last menstrual period was 10/28/2023. admitted for a RA TLH + BS.  Has 12 cm fibroid Lifelong history of menorrhagia + dysmenorrhea Does not want children in the future  PMH:    Past Medical History:  Diagnosis Date   Anxiety    Migraines     PSH:     Past Surgical History:  Procedure Laterality Date   HERNIA REPAIR      POb/GynH:      OB History     Gravida  0   Para      Term      Preterm      AB      Living         SAB      IAB      Ectopic      Multiple      Live Births              SH:   Social History   Tobacco Use   Smoking status: Every Day    Current packs/day: 1.00    Types: Cigarettes   Smokeless tobacco: Never  Vaping Use   Vaping status: Never Used  Substance Use Topics   Alcohol use: Not Currently    Comment: occasionally   Drug use: Yes    Types: Marijuana    Comment: occ    FH:    Family History  Problem Relation Age of Onset   Diabetes Maternal Grandmother    Diabetes Mother      Allergies:  Allergies  Allergen Reactions   Penicillins Rash    Medications:       Current Facility-Administered Medications:    ceFAZolin (ANCEF) 2-4 GM/100ML-% IVPB, , , ,    ceFAZolin (ANCEF) IVPB 2g/100 mL premix, 2 g, Intravenous, On Call to OR, Wendelyn Halter, MD   ketorolac (TORADOL) 30 MG/ML injection, , , ,    povidone-iodine 10 % swab 2 Application, 2 Application, Topical, Once, Wendelyn Halter, MD  Review of Systems:   Review of Systems  Constitutional: Negative for fever, chills, weight loss, malaise/fatigue and diaphoresis.  HENT: Negative for hearing loss, ear pain, nosebleeds, congestion, sore throat, neck pain, tinnitus and ear discharge.   Eyes: Negative for blurred vision, double vision, photophobia, pain, discharge and redness.  Respiratory: Negative for cough, hemoptysis, sputum production, shortness of breath, wheezing and  stridor.   Cardiovascular: Negative for chest pain, palpitations, orthopnea, claudication, leg swelling and PND.  Gastrointestinal: Positive for abdominal pain. Negative for heartburn, nausea, vomiting, diarrhea, constipation, blood in stool and melena.  Genitourinary: Negative for dysuria, urgency, frequency, hematuria and flank pain.  Musculoskeletal: Negative for myalgias, back pain, joint pain and falls.  Skin: Negative for itching and rash.  Neurological: Negative for dizziness, tingling, tremors, sensory change, speech change, focal weakness, seizures, loss of consciousness, weakness and headaches.  Endo/Heme/Allergies: Negative for environmental allergies and polydipsia. Does not bruise/bleed easily.  Psychiatric/Behavioral: Negative for depression, suicidal ideas, hallucinations, memory loss and substance abuse. The patient is not nervous/anxious and does not have insomnia.      PHYSICAL EXAM:  Blood pressure (!) 157/88, pulse 67, temperature 98.4 F (36.9 C), temperature source Oral, resp. rate 16, height 5\' 5"  (1.651 m), weight 63 kg, last menstrual period 10/28/2023, SpO2 100%.    Vitals reviewed. Constitutional: She is oriented to person, place, and time. She appears well-developed and well-nourished.  HENT:  Head: Normocephalic and atraumatic.  Right Ear: External ear normal.  Left Ear: External ear normal.  Nose: Nose normal.  Mouth/Throat: Oropharynx is clear and moist.  Eyes: Conjunctivae and EOM are normal. Pupils are equal, round, and reactive to light. Right eye exhibits no discharge. Left eye exhibits no discharge. No scleral icterus.  Neck: Normal range of motion. Neck supple. No tracheal deviation present. No thyromegaly present.  Cardiovascular: Normal rate, regular rhythm, normal heart sounds and intact distal pulses.  Exam reveals no gallop and no friction rub.   No murmur heard. Respiratory: Effort normal and breath sounds normal. No respiratory distress. She  has no wheezes. She has no rales. She exhibits no tenderness.  GI: Soft. Bowel sounds are normal. She exhibits no distension and no mass. There is tenderness. There is no rebound and no guarding.  Genitourinary:       Vulva is normal without lesions Vagina is pink moist without discharge Cervix normal in appearance and pap is normal Uterus is 16 weeks size with enlarged 12 cnm fibroid Adnexa is negative with normal sized ovaries by sonogram  Musculoskeletal: Normal range of motion. She exhibits no edema and no tenderness.  Neurological: She is alert and oriented to person, place, and time. She has normal reflexes. She displays normal reflexes. No cranial nerve deficit. She exhibits normal muscle tone. Coordination normal.  Skin: Skin is warm and dry. No rash noted. No erythema. No pallor.  Psychiatric: She has a normal mood and affect. Her behavior is normal. Judgment and thought content normal.    Labs: Results for orders placed or performed during the hospital encounter of 10/30/23 (from the past 2 weeks)  CBC   Collection Time: 10/30/23  9:10 AM  Result Value Ref Range   WBC 5.9 4.0 - 10.5 K/uL   RBC 4.32 3.87 - 5.11 MIL/uL   Hemoglobin 13.7 12.0 - 15.0 g/dL   HCT 32.4 40.1 - 02.7 %   MCV 91.9 80.0 - 100.0 fL   MCH 31.7 26.0 - 34.0 pg   MCHC 34.5 30.0 - 36.0 g/dL   RDW 25.3 66.4 - 40.3 %   Platelets 300 150 - 400 K/uL   nRBC 0.0 0.0 - 0.2 %  Comprehensive metabolic panel   Collection Time: 10/30/23  9:10 AM  Result Value Ref Range   Sodium 134 (L) 135 - 145 mmol/L   Potassium 3.7 3.5 - 5.1 mmol/L   Chloride 104 98 - 111 mmol/L   CO2 22 22 - 32 mmol/L   Glucose, Bld 87 70 - 99 mg/dL   BUN 10 6 - 20 mg/dL   Creatinine, Ser 4.74 0.44 - 1.00 mg/dL   Calcium 9.2 8.9 - 25.9 mg/dL   Total Protein 7.4 6.5 - 8.1 g/dL   Albumin 4.1 3.5 - 5.0 g/dL   AST 17 15 - 41 U/L   ALT 13 0 - 44 U/L   Alkaline Phosphatase 53 38 - 126 U/L   Total Bilirubin 0.3 0.0 - 1.2 mg/dL   GFR,  Estimated >56 >38 mL/min   Anion gap 8 5 - 15  Rapid HIV screen (HIV 1/2 Ab+Ag)   Collection Time: 10/30/23  9:10 AM  Result Value Ref Range   HIV-1 P24 Antigen - HIV24 NON REACTIVE NON REACTIVE   HIV 1/2 Antibodies NON REACTIVE NON REACTIVE   Interpretation (HIV Ag Ab)      A non reactive test result means that HIV 1 or HIV 2 antibodies and  HIV 1 p24 antigen were not detected in the specimen.  Pregnancy, urine   Collection Time: 10/30/23  9:10 AM  Result Value Ref Range   Preg Test, Ur NEGATIVE NEGATIVE  Type and screen   Collection Time: 10/30/23  9:10 AM  Result Value Ref Range   ABO/RH(D) A POS    Antibody Screen NEG    Sample Expiration 11/13/2023,2359    Extend sample reason      NO TRANSFUSIONS OR PREGNANCY IN THE PAST 3 MONTHS Performed at [facility], [address] [place], [place] [address]   Urinalysis, Routine w reflex microscopic -Urine, Clean Catch   Collection Time: 10/30/23  9:15 AM  Result Value Ref Range   Color, Urine YELLOW YELLOW   APPearance CLEAR CLEAR   Specific Gravity, Urine 1.028 1.005 - 1.030   pH 5.0 5.0 - 8.0   Glucose, UA NEGATIVE NEGATIVE mg/dL   Hgb urine dipstick MODERATE (A) NEGATIVE   Bilirubin Urine NEGATIVE NEGATIVE   Ketones, ur NEGATIVE NEGATIVE mg/dL   Protein, ur 30 (A) NEGATIVE mg/dL   Nitrite NEGATIVE NEGATIVE   Leukocytes,Ua NEGATIVE NEGATIVE   RBC / HPF 21-50 0 - 5 RBC/hpf   WBC, UA 0-5 0 - 5 WBC/hpf   Bacteria, UA NONE SEEN NONE SEEN   Squamous Epithelial / HPF 0-5 0 - 5 /HPF   Mucus PRESENT    Hyaline Casts, UA PRESENT     EKG: No orders found for this or any previous visit.  Imaging Studies: No results found.    Assessment: 12 cm fibroid Menorrhagia Dysmenorrhea   Plan: Robot assisted total laparoscopic hysterectomy with bilateral salpingectomy  Pt understands the risks of surgery including but not limited t  excessive bleeding requiring transfusion or reoperation, post-operative infection requiring  prolonged hospitalization or re-hospitalization and antibiotic therapy, and damage to other organs including bladder, bowel, ureters and major vessels.  The patient also understands the alternative treatment options which were discussed in full.  All questions were answered.  Wendelyn Halter 11/03/2023 7:21 AM   Wendelyn Halter 11/03/2023 7:19 AM

## 2023-11-03 NOTE — Discharge Instructions (Signed)

## 2023-11-03 NOTE — Progress Notes (Signed)
 Hand off was delayed due to CRNA applying nerve stimulator to pt and then hand off was after that.

## 2023-11-03 NOTE — Transfer of Care (Signed)
 Immediate Anesthesia Transfer of Care Note  Patient: Teresa Lane  Procedure(s) Performed: HYSTERECTOMY, TOTAL, ROBOT-ASSISTED, LAPAROSCOPIC, WITH BILATERAL SALPINGO-OOPHORECTOMY (Abdomen)  Patient Location: PACU  Anesthesia Type:General  Level of Consciousness: awake  Airway & Oxygen Therapy: Patient Spontanous Breathing  Post-op Assessment: Report given to RN  Post vital signs: Reviewed and stable  Last Vitals:  Vitals Value Taken Time  BP    Temp    Pulse    Resp    SpO2      Last Pain:  Vitals:   11/03/23 0631  TempSrc: Oral  PainSc: 0-No pain         Complications: No notable events documented.

## 2023-11-03 NOTE — Op Note (Signed)
 Preoperative diagnosis: Fibroid uterus Menorrhagia Dysmenorrhea Undesired fertility   Postoperative diagnosis: SAA   Procedure: dV5 Robotic hysterectomy(>250 grams) with removal of both Fallopian tubes  Laparoscopic guided transversus abdominus plane block for post op pain management  Surgeon: Wendelyn Halter, MD   Anesthesia: General endotracheal   Findings: 12 cm fibroid anteriorly Otherwise normal findings   Description of operation: Patient was taken to the operating room and placed in the low lithotomy position She was prepped and draped in the usual sterile fashion robotic assisted laparoscopic procedure A Foley catheter was placed The vagina was once again prepped additionally   A RUMI II 8 cm with 2.5 cervical cup was placed for uterine manipulation and colpotomy delineation   An incision was made above the umbilicus The umbilical fascia was grasped A varies needle was used and placed into the peritoneum with 1 pass A pneumoperitoneum was created to a pressure of 15   An 8 mm port was placed into the peritoneal cavity using a nonbladed trocar easily with 1 pass using the video laparoscope The peritoneal cavity was confirmed   There were no unusual findings 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   4 additional 8 mm ports were placed at approximately the same level as the supraumbilical port 3 of the ports were robotic and 1 is an assist port   One was left lateral and 1 was placed right lateral, both lateral to the rectus anterior muscle The other 2 were more cephalad and more medial These were placed under direct visualization without difficulty into the peritoneal cavity Nonbladed trocars were used in all instances    The patient was placed in 18 degrees of Trendelenburg   The [employer] robot was then docked to the 4 robotic ports   Instruments used during the robotic hysterectomy: Vessel sealer extender using  bipolar energy ProGrasp forceps with no energy Monopolar hook Megacut needle driver 2-0 PDS symmetrical STRATAFIX on a CT 2 needle   I then left the patient and went to the surgical console  I performed an anterior myomectomy using the monopolar hook and the VSE without difficulty. The 12 cm fibroid was placed out of the wat in the uppr abdomen   The RUMI II  was used throughout the case to apply traction and anterior/posterior displacement of the uterus to facilitate the robotic procedure The ProGrasp was used to put traction on the left adnexa The left ureter was identified and found to be well away from the adnexal vessels The vessel sealer extender using bipolar energy was used and the utero-ovarian ligament was coagulated and then transected I used traction medially and anteriorly and used the vessel sealer to take the broad ligament and round ligament down to the level of the cervical isthmus just lateral to the left uterine vessels   I then turned my attention to the right adnexa The pro grasp was used and medial and upward traction was placed The vessel sealer extender using bipolar energy was used to coagulate and ligate the right infundibulopelvic ligament vessels Again the right ureter was well inferior to the vessels I continued use medial and anterior retraction using the ProGrasp and the vessel sealer with the bipolar energy was used to take the broad ligament and round ligament down to the level of the cervical isthmus and lateral to the uterine vessels   I then placed the monopolar hook in the place of the ProGrasp The vessel sealer  extender was used to grasp the peritoneum of the bladder provide traction, of course no energy was used for this I used the monopolar hook with energy to open of the peritoneal leaf anteriorly and dissect the bladder peritoneum off the lower uterine segment and cervix beyond the level of the vaginal colpotomy cup I then used the monopolar hook to  take the peritoneum down where I stopped using the vessel sealer and met in the bladder dissection bilaterally   The vessel sealer extender with monopolar energy was used to coagulate the uterine vessels at the level of the cervical isthmus bilaterally and then just above and just below to manage backbleeding The uterine vessels were transected There was good hemostasis I then stayed medial to this uterine vessel pedicle with the vessel sealer extender and took down the paracervical tissue to allow colpotomy incision using the monopolar hook, preserving the cardinal ligament Again there was good hemostasis bilaterally   I then used the monopolar hook and made a posterior colpotomy incision above the level of insertion of the uterosacral ligaments I used a frowny face then smiley face technique and opened the vagina to approximately 8:00 and 4:00 posteriorly I then used the vessel sealer extender with bipolar energy, coagulating and then transecting the vagina   The monopolar hook was used to make the anterior colpotomy incision as the bladder had been dissected well past the colpotomy ring Cephalad tension was placed on the Vcare handle throughout this portion of the procedure to prevent thermal injury to the bladder and safe distance from the ureters bilaterally Colpotomy incision was made from 10:00 to 2:00 The vessel sealer with bipolar energy was then used to coagulate and transect the vagina, again inside of the cardinal ligament   The uterus was removed through the vagina  I butterflied the fibroid using the hook and VSE and removed it throught the vagina as well   The tubes were also removed through the vagina A wet lap tape was placed in the vagina to maintain pneumoperitoneum   All pedicles were found to be hemostatic there was very little blood loss I then removed the vessel sealer and monopolar hook The pro grasp was replaced and the needle driver was also placed along with the  suture   The vagina was closed using the 2-0 PDS STRATAFIX symmetrical suture on the CT 2 needle I pay close attention to the vaginal corners bilaterally and incorporated those in the closure 1.5 cm of vagina was operating to the vaginal closure I also fixed the uterosacral ligaments to the vaginal cuff repair shortening the uterosacral ligaments thus providing improved vaginal vault suspension The cardinal ligaments were intact again improving postoperative vaginal support Pubocervical rectovaginal and posterior peritoneum were all included in the vaginal closure There was good result hemostasis   At the end of the procedure all the pedicles were identified and found to be hemostatic Both ureters were identified and undergoing normal peristalsis, they were well out of the way of surgical field throughout   I then got up from the console and went back to the side of the patient after gowning and gloving sterilely  I placed a  transversus abdominus plane block was placed using laparoscopic guidance at T10 and T7 bilaterally, 10 cc of 0.25% bupivacaine at each site, 40 cc total of 100 mg of bupivacaine.  Doyle's bubble technique was used. This was placed for post operative pain management.        The insufflation ports  were used to actively desufflate the peritoneal cavity to a pressure of 0 Ports were then removed   The subcutaneous tissue of all 5 incisions was closed using 0 Vicryl All 5 skin incisions were closed using 3-0 vicryl in a subcuticular manner Dermabond was used all 5 incision sites  Each incision was dressed   The patient tolerated the procedure well She received 2 g of Ancef and 30 mg of Toradol preoperatively   EBL: 200 cc, most measured blood loss was the blood inside the fibroid while butterflying it, essentially noe doing the hysterectomy itself   Wendelyn Halter, MD 11/03/2023 11:27 AM

## 2023-11-04 ENCOUNTER — Encounter (HOSPITAL_COMMUNITY): Payer: Self-pay | Admitting: Obstetrics & Gynecology

## 2023-11-05 LAB — SURGICAL PATHOLOGY

## 2023-11-06 NOTE — Anesthesia Postprocedure Evaluation (Signed)
 Anesthesia Post Note  Patient: Teresa Lane  Procedure(s) Performed: HYSTERECTOMY, TOTAL, ROBOT-ASSISTED, LAPAROSCOPIC, WITH BILATERAL SALPINGO-OOPHORECTOMY (Abdomen)  Patient location during evaluation: Phase II Anesthesia Type: General Level of consciousness: awake Pain management: pain level controlled Vital Signs Assessment: post-procedure vital signs reviewed and stable Respiratory status: spontaneous breathing and respiratory function stable Cardiovascular status: blood pressure returned to baseline and stable Postop Assessment: no headache and no apparent nausea or vomiting Anesthetic complications: no Comments: Late entry   No notable events documented.   Last Vitals:  Vitals:   11/03/23 1230 11/03/23 1250  BP: (!) 177/93 (!) 159/72  Pulse: (!) 57 (!) 57  Resp: 19 18  Temp:  36.5 C  SpO2: 100% 98%    Last Pain:  Vitals:   11/03/23 1250  TempSrc: Oral  PainSc: 7                  Coretha Dew

## 2023-11-10 ENCOUNTER — Encounter: Payer: Self-pay | Admitting: Obstetrics & Gynecology

## 2023-11-10 ENCOUNTER — Telehealth: Payer: Self-pay | Admitting: Obstetrics & Gynecology

## 2023-11-10 ENCOUNTER — Telehealth: Admitting: Obstetrics & Gynecology

## 2023-11-10 DIAGNOSIS — Z9889 Other specified postprocedural states: Secondary | ICD-10-CM

## 2023-11-10 MED ORDER — BISACODYL 10 MG RE SUPP: 10.0000 mg | RECTAL | Status: DC | PRN

## 2023-11-10 NOTE — Progress Notes (Signed)
 MyChart video + audio visit Pt is at home  I am in my office Total time 10 minutes:   HPI: Patient returns for routine postoperative follow-up having undergone RA TLH + BS on 11/03/23.  The patient's immediate postoperative recovery has been unremarkable. Since hospital discharge the patient reports constipation otherwise doing well.   Current Outpatient Medications: ciprofloxacin  (CIPRO ) 500 MG tablet, Take 1 tablet (500 mg total) by mouth 2 (two) times daily., Disp: 14 tablet, Rfl: 0 ketorolac  (TORADOL ) 10 MG tablet, Take 1 tablet (10 mg total) by mouth every 8 (eight) hours as needed., Disp: 15 tablet, Rfl: 0 oxyCODONE -acetaminophen  (PERCOCET) 7.5-325 MG tablet, Take 1 tablet by mouth every 6 (six) hours as needed., Disp: 28 tablet, Rfl: 0 busPIRone  (BUSPAR ) 5 MG tablet, Take 1 tablet (5 mg total) by mouth 2 (two) times daily. (Patient not taking: Reported on 11/10/2023), Disp: 60 tablet, Rfl: 3 ondansetron  (ZOFRAN -ODT) 8 MG disintegrating tablet, Take 1 tablet (8 mg total) by mouth every 8 (eight) hours as needed for nausea or vomiting. (Patient not taking: Reported on 11/10/2023), Disp: 8 tablet, Rfl: 0 SUMAtriptan (IMITREX) 25 MG tablet, Take 25 mg by mouth every 2 (two) hours as needed for migraine. (Patient not taking: Reported on 11/10/2023), Disp: , Rfl:   No current facility-administered medications for this visit.    Last menstrual period 10/28/2023.  Physical Exam:   Diagnostic Tests:   Pathology: benign  Impression + Management plan:   ICD-10-CM   1. Post-operative state: RA TLH + BS 11/03/23  Z98.890    post op constipation: recommend dulcolax suppository or its generic equivalent     Meds ordered this encounter  Medications   bisacodyl (DULCOLAX) 10 MG suppository    Sig: Place 1 suppository (10 mg total) rectally as needed for moderate constipation.        Medications Prescribed this encounter: No orders of the defined types were placed in this  encounter.     Follow up: Return in about 5 weeks (around 12/15/2023) for Post Op, with Dr Randolm Butte.    Wendelyn Halter, MD Attending Physician for the Center for [facility] and [facility] 11/10/2023 11:29 AM

## 2023-11-10 NOTE — Telephone Encounter (Signed)
 Patient is asking if her paper work for work restriction could be updated for her to be able to wear cloths with elastic in the paints.

## 2023-11-19 ENCOUNTER — Encounter (HOSPITAL_COMMUNITY): Payer: Self-pay | Admitting: Obstetrics & Gynecology

## 2023-12-16 ENCOUNTER — Encounter: Admitting: Obstetrics & Gynecology

## 2023-12-17 ENCOUNTER — Ambulatory Visit: Admitting: Obstetrics & Gynecology

## 2023-12-17 ENCOUNTER — Encounter: Payer: Self-pay | Admitting: Obstetrics & Gynecology

## 2023-12-17 VITALS — BP 129/92 | Ht 65.0 in | Wt 133.0 lb

## 2023-12-17 DIAGNOSIS — Z9889 Other specified postprocedural states: Secondary | ICD-10-CM

## 2023-12-17 NOTE — Progress Notes (Signed)
  HPI: Patient returns for routine postoperative follow-up having undergone RA TLH + BS on 11/03/23.  The patient's immediate postoperative recovery has been unremarkable. Since hospital discharge the patient reports no problems.   Current Outpatient Medications: bisacodyl  (DULCOLAX) 10 MG suppository, Place 1 suppository (10 mg total) rectally as needed for moderate constipation., Disp: , Rfl:  oxyCODONE -acetaminophen  (PERCOCET) 7.5-325 MG tablet, Take 1 tablet by mouth every 6 (six) hours as needed., Disp: 28 tablet, Rfl: 0 busPIRone  (BUSPAR ) 5 MG tablet, Take 1 tablet (5 mg total) by mouth 2 (two) times daily. (Patient not taking: Reported on 12/17/2023), Disp: 60 tablet, Rfl: 3 ciprofloxacin  (CIPRO ) 500 MG tablet, Take 1 tablet (500 mg total) by mouth 2 (two) times daily. (Patient not taking: Reported on 12/17/2023), Disp: 14 tablet, Rfl: 0 ketorolac  (TORADOL ) 10 MG tablet, Take 1 tablet (10 mg total) by mouth every 8 (eight) hours as needed. (Patient not taking: Reported on 12/17/2023), Disp: 15 tablet, Rfl: 0 ondansetron  (ZOFRAN -ODT) 8 MG disintegrating tablet, Take 1 tablet (8 mg total) by mouth every 8 (eight) hours as needed for nausea or vomiting. (Patient not taking: Reported on 12/17/2023), Disp: 8 tablet, Rfl: 0 SUMAtriptan (IMITREX) 25 MG tablet, Take 25 mg by mouth every 2 (two) hours as needed for migraine. (Patient not taking: Reported on 12/17/2023), Disp: , Rfl:   No current facility-administered medications for this visit.    Blood pressure (!) 129/92, height 5' 5 (1.651 m), weight 133 lb (60.3 kg), last menstrual period 10/28/2023.  Physical Exam: All incisions look good  Abdomen is benign Cuff is healing well no masses or tenderness  Diagnostic Tests:   Pathology: benign  Impression + Management plan:   ICD-10-CM   1. Post-operative state: RA TLH + BS 11/03/23  Z98.890           Medications Prescribed this encounter: No orders of the defined types were placed  in this encounter.     Follow up: prn   Wendelyn Halter, MD Attending Physician for the Center for [facility] and [facility] 12/17/2023 12:02 PM

## 2023-12-22 ENCOUNTER — Other Ambulatory Visit: Payer: Self-pay | Admitting: Adult Health

## 2024-03-11 ENCOUNTER — Encounter: Payer: Self-pay | Admitting: Adult Health

## 2024-03-11 ENCOUNTER — Ambulatory Visit (INDEPENDENT_AMBULATORY_CARE_PROVIDER_SITE_OTHER): Admitting: Adult Health

## 2024-03-11 VITALS — BP 146/96 | HR 79 | Ht 65.0 in | Wt 138.5 lb

## 2024-03-11 DIAGNOSIS — R030 Elevated blood-pressure reading, without diagnosis of hypertension: Secondary | ICD-10-CM | POA: Diagnosis not present

## 2024-03-11 DIAGNOSIS — F32A Depression, unspecified: Secondary | ICD-10-CM | POA: Diagnosis not present

## 2024-03-11 DIAGNOSIS — F419 Anxiety disorder, unspecified: Secondary | ICD-10-CM | POA: Diagnosis not present

## 2024-03-11 NOTE — Progress Notes (Signed)
 Subjective:     Patient ID: Teresa Lane, female   DOB: 1986/04/16, 38 y.o.   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   PCP is Dr Carlette.  Review of Systems +anxiety, moody Trouble concentrating at work Reviewed past medical,surgical, social and family history. Reviewed medications and allergies.       Objective:   Physical Exam BP (!) 146/96 (BP Location: Right Arm, Patient Position: Sitting, Cuff Size: Normal)   Pulse 79   Ht 5' 5 (1.651 m)   Wt 138 lb 8 oz (62.8 kg)   LMP 10/28/2023 Comment: Urine Pregnancy Test Negative as of 10/30/23.  BMI 23.05 kg/m   Skin warm and dry.  Lungs: clear to ausculation bilaterally. Cardiovascular: regular rate and rhythm.        03/11/2024   10:20 AM 08/11/2023   10:14 AM  Depression screen PHQ 2/9  Decreased Interest 3 2  Down, Depressed, Hopeless 3 0  PHQ - 2 Score 6 2  Altered sleeping 3 0  Tired, decreased energy 3 1  Change in appetite 3 0  Feeling bad or failure about yourself  0 0  Trouble concentrating 3 0  Moving slowly or fidgety/restless 1 0  Suicidal thoughts 0 0  PHQ-9 Score 19 3  Difficult doing work/chores Very difficult        03/11/2024   10:23 AM 08/11/2023   10:14 AM  GAD 7 : Generalized Anxiety Score  Nervous, Anxious, on Edge 3 1  Control/stop worrying 3 3  Worry too much - different things 3 3  Trouble relaxing 3 0  Restless 3 1  Easily annoyed or irritable 3 1  Afraid - awful might happen 0 0  Total GAD 7 Score 18 9     Upstream - 03/11/24 1018       Pregnancy Intention Screening   Does the patient want to become pregnant in the next year? N/A    Does the patient's partner want to become pregnant in the next year? N/A    Would the patient like to discuss contraceptive options today? N/A      Contraception Wrap Up   Current Method Female Sterilization   hyst   End Method Female Sterilization   hyst   Contraception Counseling Provided No            Assessment:     1. Anxiety and depression (Primary) Has trouble concentrating at work, due to noise Is back on Buspar , 5 mg 1 bid, has refills, but she did not take after surgery till she went back to work, she wants to continue this for now Will give note to wear 1 ear bud with music at work and paper for accommodation filled out for her Will refer to [facility] - Ambulatory referral to Behavioral Health  2. Elevated BP without diagnosis of hypertension Follow up with PCP about BP      Plan:  Follow up with me in 4 weeks to see if better after being back on Buspar 

## 2024-04-08 ENCOUNTER — Ambulatory Visit: Admitting: Adult Health

## 2024-04-08 ENCOUNTER — Encounter: Payer: Self-pay | Admitting: Adult Health

## 2024-04-08 VITALS — BP 132/89 | HR 81 | Ht 65.0 in | Wt 138.5 lb

## 2024-04-08 DIAGNOSIS — F32A Depression, unspecified: Secondary | ICD-10-CM

## 2024-04-08 DIAGNOSIS — F419 Anxiety disorder, unspecified: Secondary | ICD-10-CM | POA: Diagnosis not present

## 2024-04-08 DIAGNOSIS — Z1331 Encounter for screening for depression: Secondary | ICD-10-CM | POA: Diagnosis not present

## 2024-04-08 MED ORDER — BUSPIRONE HCL 7.5 MG PO TABS: 7.5000 mg | ORAL_TABLET | Freq: Two times a day (BID) | ORAL | 3 refills | Status: DC

## 2024-04-08 MED ORDER — SERTRALINE HCL 50 MG PO TABS: 50.0000 mg | ORAL_TABLET | Freq: Every day | ORAL | 3 refills | Status: DC

## 2024-04-08 NOTE — Progress Notes (Signed)
 Subjective:     Patient ID: Teresa Lane, female   DOB: October 08, 1985, 38 y.o.   MRN: [id number]  HPI Teresa Lane is a 38 year old black female,single, sp hysterectomy, back in follow up on anxiety and depression, and getting back on Buspar , and not really better yet, having trouble sleeping. She is using ear buds at work, she works nights at [employer].  She has not heard from [facility] yet.  PCP is Dr Carlette  Review of Systems +anxiety and depression Has trouble sleeping    Reviewed past medical,surgical, social and family history. Reviewed medications and allergies.  Objective:   Physical Exam BP 132/89 (BP Location: Right Arm, Patient Position: Sitting, Cuff Size: Normal)   Pulse 81   Ht 5' 5 (1.651 m)   Wt 138 lb 8 oz (62.8 kg)   LMP 10/28/2023 Comment: Urine Pregnancy Test Negative as of 10/30/23.  BMI 23.05 kg/m     Skin warm and dry.Lungs: clear to ausculation bilaterally. Cardiovascular: regular rate and rhythm.     04/08/2024   10:34 AM 03/11/2024   10:20 AM 08/11/2023   10:14 AM  Depression screen PHQ 2/9  Decreased Interest 3 3 2   Down, Depressed, Hopeless 3 3 0  PHQ - 2 Score 6 6 2   Altered sleeping 3 3 0  Tired, decreased energy 3 3 1   Change in appetite 3 3 0  Feeling bad or failure about yourself  2 0 0  Trouble concentrating 3 3 0  Moving slowly or fidgety/restless  1 0  Suicidal thoughts 0 0 0  PHQ-9 Score 20 19 3   Difficult doing work/chores  Very difficult        04/08/2024   10:38 AM 03/11/2024   10:23 AM 08/11/2023   10:14 AM  GAD 7 : Generalized Anxiety Score  Nervous, Anxious, on Edge 3 3 1   Control/stop worrying 3 3 3   Worry too much - different things 3 3 3   Trouble relaxing 3 3 0  Restless 2 3 1   Easily annoyed or irritable 3 3 1   Afraid - awful might happen 2 0 0  Total GAD 7 Score 19 18 9       Upstream - 04/08/24 1034       Pregnancy Intention Screening   Does the patient want to become pregnant in the next year? N/A    Does the patient's  partner want to become pregnant in the next year? N/A    Would the patient like to discuss contraceptive options today? N/A      Contraception Wrap Up   Current Method Female Sterilization   hyst   End Method Female Sterilization   hyst   Contraception Counseling Provided No          Assessment:     1. Anxiety and depression (Primary) Will increase buspar  to 7.5 mg 1 bid and will add zoloft 50 mg 1 daily Meds ordered this encounter  Medications   busPIRone  (BUSPAR ) 7.5 MG tablet    Sig: Take 1 tablet (7.5 mg total) by mouth 2 (two) times daily.    Dispense:  60 tablet    Refill:  3    Supervising Provider:   JAYNE MINDER H [[id number]]   sertraline (ZOLOFT) 50 MG tablet    Sig: Take 1 tablet (50 mg total) by mouth daily.    Dispense:  30 tablet    Refill:  3    Supervising Provider:   JAYNE MINDER H [[id number]]  Resent BH referral, if has not heard from them in a week, call and ask to speak with Rosaline  - Ambulatory referral to Behavioral Health     Plan:     Follow up in 4 weeks

## 2024-05-06 ENCOUNTER — Ambulatory Visit: Admitting: Adult Health

## 2024-05-06 ENCOUNTER — Encounter: Payer: Self-pay | Admitting: Adult Health

## 2024-05-06 VITALS — BP 134/86 | HR 82 | Ht 65.0 in | Wt 138.5 lb

## 2024-05-06 DIAGNOSIS — F419 Anxiety disorder, unspecified: Secondary | ICD-10-CM

## 2024-05-06 DIAGNOSIS — R030 Elevated blood-pressure reading, without diagnosis of hypertension: Secondary | ICD-10-CM | POA: Diagnosis not present

## 2024-05-06 DIAGNOSIS — F32A Depression, unspecified: Secondary | ICD-10-CM | POA: Diagnosis not present

## 2024-05-06 DIAGNOSIS — R61 Generalized hyperhidrosis: Secondary | ICD-10-CM | POA: Diagnosis not present

## 2024-05-06 DIAGNOSIS — R232 Flushing: Secondary | ICD-10-CM

## 2024-05-06 DIAGNOSIS — Z90710 Acquired absence of both cervix and uterus: Secondary | ICD-10-CM

## 2024-05-06 DIAGNOSIS — R4589 Other symptoms and signs involving emotional state: Secondary | ICD-10-CM

## 2024-05-06 DIAGNOSIS — Z1331 Encounter for screening for depression: Secondary | ICD-10-CM | POA: Diagnosis not present

## 2024-05-06 NOTE — Progress Notes (Signed)
 Subjective:     Patient ID: Teresa Lane, female   DOB: [birth year], 38 y.o.   MRN: [id number]  HPI Teresa Lane is a 38 year old black female,single, sp hysterectomy, back in follow up on anxiety and depression and is doing better, they have noticed at work. She works nights at [employer]. She is having some hot flashes, night sweats and moody at times.  PCP is Dr Carlette  Review of Systems Feels much better, they have noticed at work She is having some hot flashes and night sweats and moody at times Reviewed past medical,surgical, social and family history. Reviewed medications and allergies.     Objective:   Physical Exam BP 134/86 (BP Location: Left Arm, Patient Position: Sitting, Cuff Size: Normal)   Pulse 82   Ht 5' 5 (1.651 m)   Wt 138 lb 8 oz (62.8 kg)   LMP 10/28/2023 Comment: Urine Pregnancy Test Negative as of 10/30/23.  BMI 23.05 kg/m     Skin warm and dry.  Lungs: clear to ausculation bilaterally. Cardiovascular: regular rate and rhythm.    05/06/2024   11:09 AM 04/08/2024   10:34 AM 03/11/2024   10:20 AM  Depression screen PHQ 2/9  Decreased Interest 1 3 3   Down, Depressed, Hopeless 1 3 3   PHQ - 2 Score 2 6 6   Altered sleeping 1 3 3   Tired, decreased energy 1 3 3   Change in appetite 1 3 3   Feeling bad or failure about yourself   2 0  Trouble concentrating 0 3 3  Moving slowly or fidgety/restless 0  1  Suicidal thoughts 0 0 0  PHQ-9 Score 5 20  19    Difficult doing work/chores   Very difficult     Data saved with a previous flowsheet row definition        05/06/2024   11:12 AM 04/08/2024   10:38 AM 03/11/2024   10:23 AM 08/11/2023   10:14 AM  GAD 7 : Generalized Anxiety Score  Nervous, Anxious, on Edge 1 3 3 1   Control/stop worrying 1 3 3 3   Worry too much - different things 1 3 3 3   Trouble relaxing 1 3 3  0  Restless 1 2 3 1   Easily annoyed or irritable 1 3 3 1   Afraid - awful might happen 0 2 0 0  Total GAD 7 Score 6 19 18 9       Upstream - 05/06/24  1115       Pregnancy Intention Screening   Does the patient want to become pregnant in the next year? N/A    Does the patient's partner want to become pregnant in the next year? N/A    Would the patient like to discuss contraceptive options today? N/A      Contraception Wrap Up   Current Method Female Sterilization   hyst   End Method Female Sterilization   hyst   Contraception Counseling Provided No          Assessment:    1. Anxiety and depression (Primary) Feels much better Will continue Buspar  7.5 mg 1 bid and zoloft 50 mg 1 daily, has refills   2. Elevated BP without diagnosis of hypertension Follow up with PCP  3. Hot flashes Can experience hot flashes after hysterctomy   4. Night sweats  5. The [employer] at times, declines increasing Zoloft   6. S/P hysterectomy      Plan:     Return about 07/08/2024 for follow up with me

## 2024-07-08 ENCOUNTER — Ambulatory Visit: Admitting: Adult Health

## 2024-07-08 ENCOUNTER — Encounter: Payer: Self-pay | Admitting: Adult Health

## 2024-07-08 VITALS — BP 128/86 | HR 77 | Ht 65.0 in | Wt 145.5 lb

## 2024-07-08 DIAGNOSIS — Z90710 Acquired absence of both cervix and uterus: Secondary | ICD-10-CM | POA: Insufficient documentation

## 2024-07-08 DIAGNOSIS — R232 Flushing: Secondary | ICD-10-CM | POA: Diagnosis not present

## 2024-07-08 DIAGNOSIS — F419 Anxiety disorder, unspecified: Secondary | ICD-10-CM

## 2024-07-08 DIAGNOSIS — F418 Other specified anxiety disorders: Secondary | ICD-10-CM

## 2024-07-08 DIAGNOSIS — G479 Sleep disorder, unspecified: Secondary | ICD-10-CM | POA: Insufficient documentation

## 2024-07-08 MED ORDER — HYDROXYZINE PAMOATE 25 MG PO CAPS: ORAL_CAPSULE | ORAL | 3 refills | Status: AC

## 2024-07-08 MED ORDER — SERTRALINE HCL 50 MG PO TABS: 50.0000 mg | ORAL_TABLET | Freq: Every day | ORAL | 6 refills | Status: AC

## 2024-07-08 MED ORDER — BUSPIRONE HCL 7.5 MG PO TABS: 7.5000 mg | ORAL_TABLET | Freq: Two times a day (BID) | ORAL | 6 refills | Status: AC

## 2024-07-08 NOTE — Progress Notes (Signed)
 " Subjective:     Patient ID: Teresa Lane, female   DOB: 03-22-1986, 39 y.o.   MRN: [id number]  HPI Teresa Lane is a 39 year old black female, single, sp hysterectomy, back in follow up on anxiety and depression and doing OK she says.  PCP is Dr Carlette  Review of Systems Doing OK Not sleeping well Has occasional hot flash Reviewed past medical,surgical, social and family history. Reviewed medications and allergies.     Objective:   Physical Exam BP 128/86 (BP Location: Right Arm, Patient Position: Sitting, Cuff Size: Normal)   Pulse 77   Ht 5' 5 (1.651 m)   Wt 145 lb 8 oz (66 kg)   LMP 10/28/2023 Comment: Urine Pregnancy Test Negative as of 10/30/23.  BMI 24.21 kg/m     Skin warm and dry. Lungs: clear to ausculation bilaterally. Cardiovascular: regular rate and rhythm.  Fall risk is low    07/08/2024    9:15 AM 05/06/2024   11:09 AM 04/08/2024   10:34 AM  Depression screen PHQ 2/9  Decreased Interest 1 1 3   Down, Depressed, Hopeless 0 1 3  PHQ - 2 Score 1 2 6   Altered sleeping 3 1 3   Tired, decreased energy 1 1 3   Change in appetite 1 1 3   Feeling bad or failure about yourself  0  2  Trouble concentrating 0 0 3  Moving slowly or fidgety/restless 0 0   Suicidal thoughts 0 0 0  PHQ-9 Score 6 5 20       Data saved with a previous flowsheet row definition       07/08/2024    9:18 AM 05/06/2024   11:12 AM 04/08/2024   10:38 AM 03/11/2024   10:23 AM  GAD 7 : Generalized Anxiety Score  Nervous, Anxious, on Edge 2 1 3 3   Control/stop worrying 0 1 3 3   Worry too much - different things 1 1 3 3   Trouble relaxing 2 1 3 3   Restless 1 1 2 3   Easily annoyed or irritable 1 1 3 3   Afraid - awful might happen 2 0 2 0  Total GAD 7 Score 9 6 19 18     Upstream - 07/08/24 9077       Pregnancy Intention Screening   Does the patient want to become pregnant in the next year? N/A    Does the patient's partner want to become pregnant in the next year? N/A    Would the patient like to  discuss contraceptive options today? N/A      Contraception Wrap Up   Current Method Hysterectomy    End Method Hysterectomy    Contraception Counseling Provided No            Assessment:     1. Anxiety and depression (Primary) She says she is doing OK, good on current meds, will refill buspar  and zoloft  She [facility] heard from [facility] not want another referral  Meds ordered this encounter  Medications   busPIRone  (BUSPAR ) 7.5 MG tablet    Sig: Take 1 tablet (7.5 mg total) by mouth 2 (two) times daily.    Dispense:  60 tablet    Refill:  6    Supervising Provider:   JAYNE MINDER H [[id number]]   sertraline  (ZOLOFT ) 50 MG tablet    Sig: Take 1 tablet (50 mg total) by mouth daily.    Dispense:  30 tablet    Refill:  6    Supervising Provider:   JAYNE,  LUTHER H [[id number]]   hydrOXYzine  (VISTARIL ) 25 MG capsule    Sig: Take 1 at bedtime    Dispense:  30 capsule    Refill:  3    Supervising Provider:   JAYNE MINDER H [[id number]]     2. Hot flashes Has occasional   3. S/P hysterectomy  4. Sleep disturbance Will try vistaril  25 mg 1 at bedtime     Plan:     Follow up in 6 months or sooner if needed     "
